# Patient Record
Sex: Female | Born: 1970 | Race: White | Hispanic: No | Marital: Single | Smoking: Never smoker
Health system: Southern US, Community
[De-identification: ages and names within clinical notes are randomized; demographics above are authoritative.]

## PROBLEM LIST (undated history)

## (undated) DIAGNOSIS — Z9889 Other specified postprocedural states: Secondary | ICD-10-CM

## (undated) DIAGNOSIS — R002 Palpitations: Secondary | ICD-10-CM

## (undated) DIAGNOSIS — I471 Supraventricular tachycardia, unspecified: Secondary | ICD-10-CM

## (undated) HISTORY — DX: Supraventricular tachycardia: I47.1

## (undated) HISTORY — PX: NO PAST SURGERIES: SHX2092

## (undated) HISTORY — DX: Palpitations: R00.2

## (undated) HISTORY — DX: Supraventricular tachycardia, unspecified: I47.10

---

## 2003-11-25 ENCOUNTER — Emergency Department (HOSPITAL_COMMUNITY): Admission: EM | Admit: 2003-11-25 | Discharge: 2003-11-25 | Payer: Self-pay | Admitting: Emergency Medicine

## 2011-12-19 ENCOUNTER — Telehealth: Payer: Self-pay | Admitting: Family Medicine

## 2011-12-19 ENCOUNTER — Encounter: Payer: Self-pay | Admitting: Family Medicine

## 2011-12-19 DIAGNOSIS — B351 Tinea unguium: Secondary | ICD-10-CM

## 2011-12-19 NOTE — Telephone Encounter (Signed)
Message copied by Pearline Cables on Tue Dec 19, 2011  4:27 PM ------      Message from: Abbe Amsterdam C      Created: Fri Dec 15, 2011 10:00 AM       Pt wonders about her fingernails- question refill of terbinafine.  I will pull chart and see

## 2011-12-19 NOTE — Telephone Encounter (Signed)
Anne Casey was in clinic with her mother a few days ago and asked about refilling her terbinafine to treat nail fungus.  Actually looking back in her chart I noted that we had already tried treating her twice, in April and in November of 2012.  I think we should refer her to dermatology instead of doing another round of lamisil.  Called home and work numbers but was unable to reach her.  Will go ahead and do a derm referral and send letter to her.

## 2012-01-26 ENCOUNTER — Other Ambulatory Visit: Payer: Self-pay | Admitting: Family Medicine

## 2012-01-26 MED ORDER — TERBINAFINE HCL 250 MG PO TABS: 250.0000 mg | ORAL_TABLET | Freq: Every day | ORAL | Status: DC

## 2012-01-26 NOTE — Progress Notes (Signed)
Spoke with Anne Casey on the phone this evening.  She is still very frustrated by fungal fingernails- see letter sent to her last month.  However, she has no insurance and really does not want to see a dermatologist.  I agreed to try lamisil one more time but one more time only.  Will rx to her pharmacy.

## 2012-07-04 LAB — CBC AND DIFFERENTIAL
HCT: 40 % (ref 36–46)
Hemoglobin: 12.8 g/dL (ref 12.0–16.0)
WBC: 8.5 10^3/mL

## 2012-08-09 ENCOUNTER — Ambulatory Visit: Payer: Self-pay | Admitting: Family Medicine

## 2012-08-09 VITALS — BP 132/88 | HR 72 | Temp 98.7°F | Resp 18 | Ht 67.0 in | Wt 243.0 lb

## 2012-08-09 DIAGNOSIS — B351 Tinea unguium: Secondary | ICD-10-CM

## 2012-08-09 DIAGNOSIS — I471 Supraventricular tachycardia: Secondary | ICD-10-CM

## 2012-08-09 DIAGNOSIS — I498 Other specified cardiac arrhythmias: Secondary | ICD-10-CM

## 2012-08-09 MED ORDER — METOPROLOL TARTRATE 50 MG PO TABS: 50.0000 mg | ORAL_TABLET | Freq: Every day | ORAL | Status: DC

## 2012-08-09 NOTE — Progress Notes (Signed)
[facility] [address], [place] [address] [phone]  Date:  08/09/2012   Name:  Anne Casey   DOB:  Jan 30, 1971   MRN:  [id number]  PCP:  Abbe Amsterdam, MD    Chief Complaint: follow up from hosptial visit and Medication Refill   History of Present Illness:  Anne Casey is a 41 y.o. very pleasant female patient who presents with the following:  She is here today for hospital follow-up. She was in [place] and noted the onset of heart palpations while in a store.  She has noted this in the past and used valarian root. However, this time her remedy did not work. She had a lot of pain and felt very bad- EMS came to get her and took her to [facility] center in [place], [place]. This occurred last month on 07/04/12.   While in the ED she had an evaluation including D dimer, TSH, cardiac enzymes which were ok.  She was released with Toprol XL 50 mg and a diagnosis of PSVT. She has been taking her metoprolol since. The tachycardia/ palpitations have not come back.  She sometimes feels "weak" around her heart- like the area is a little bit sore.  Taking her metoprolol resolves this problem- she takes it in the evening.  She usually feels like she is "ready to have her medication" by the time her next dose is due.    Per her report EMS had to shock her in the ambulance to regain BP.  I do not have any records that mention this, but from her report it sounds like her rate was so fast that she was losing BP.  She was conscious when she was shocked.   She has been avoiding caffeine, but she never did drink much caffeine.  She does not do drugs/ cocaine.  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  She works 1st, 2nd and 3rd shifts and her hours are constantly changing.  She sometimes does not get enough sleep and has a very hard time sleeping during the day when she works third shift.   She has noted a chest pressure with  working hard at [employer].  This has not changed and has been going on for some years.   She will be eligible for insurance through her job by 11/101/3 approx.   She would also like to further evaluate chronic nail changes on the ring finger of her left hand.  She has used lamisil but the apparent fungal infection never cleared.    There is no problem list on file for this patient.   History reviewed. No pertinent past medical history.  History reviewed. No pertinent past surgical history.  History  Substance Use Topics  . Smoking status: Never Smoker   . Smokeless tobacco: Not on file  . Alcohol Use: No    History reviewed. No pertinent family history.  No Known Allergies  Medication list has been reviewed and updated.  Current Outpatient Prescriptions on File Prior to Visit  Medication Sig Dispense Refill  . metoprolol (LOPRESSOR) 50 MG tablet Take 50 mg by mouth 2 (two) times daily.        Review of Systems:  As per HPI- otherwise negative.   Physical Examination: Filed Vitals:   08/09/12 0920  BP: 132/88  Pulse: 72  Temp: 98.7 F (37.1 C)  Resp: 18   Filed Vitals:  08/09/12 0920  Height: 5\' 7"  (1.702 m)  Weight: 243 lb (110.224 kg)   Body mass index is 38.06 kg/(m^2). Ideal Body Weight: Weight in (lb) to have BMI = 25: 159.3   GEN: WDWN, NAD, Non-toxic, A & O x 3, overweight HEENT: Atraumatic, Normocephalic. Neck supple. No masses, No LAD. Ears and Nose: No external deformity. CV: RRR, No M/G/R. No JVD. No thrill. No extra heart sounds. PULM: CTA B, no wheezes, crackles, rhonchi. No retractions. No resp. distress. No accessory muscle use. ABD: S, NT, ND EXTR: No c/c/e. Her left ring finger nail shows crumbling and thickness consistent with a fungal infection NEURO Normal gait.  PSYCH: Normally interactive. Conversant. Not depressed or anxious appearing.  Calm demeanor.   EKG: NSR, no tachycardia.  Read as normal by computer.  Some early repol  changes in V3 and V4 which appear identical to her EKG obtained from her ED visit   Assessment and Plan:  1. SVT (supraventricular tachycardia)  EKG 12-Lead, metoprolol (LOPRESSOR) 50 MG tablet, Ambulatory referral to Cardiology  2. Fungal nail infection  Ambulatory referral to Dermatology   Anne Casey was diagnosed with SVT a month ago, and is doing well with metoprolol.  However, the fact that she needed cardioversion is concerning.  I will refer her to cardiology for an evaluation.  She would like to wait until her insurance kicks in- this will be in about 2 weeks.  As she has been stable for a month since her ED visit this is reasonable.  She will let me know if she has any problems in the meantime.  She has no acute CP but may need an ETT for her stable chest discomfort (present for years).  Will have her start asa 81 mg  Also refer to derm for her nail problem COPLAND,JESSICA, MD

## 2012-08-09 NOTE — Patient Instructions (Addendum)
I will schedule you to see a cardiologist. If your symptoms come back or change in the meantime get help right away!  Start a baby aspirin (81mg ) in the meantime.    I will also arrange for you to see a dermatologist.

## 2012-08-14 ENCOUNTER — Encounter: Payer: Self-pay | Admitting: *Deleted

## 2012-08-23 ENCOUNTER — Encounter: Payer: Self-pay | Admitting: *Deleted

## 2012-08-23 DIAGNOSIS — R002 Palpitations: Secondary | ICD-10-CM | POA: Insufficient documentation

## 2012-09-02 ENCOUNTER — Ambulatory Visit: Payer: Self-pay | Admitting: Cardiovascular Disease

## 2013-02-12 ENCOUNTER — Other Ambulatory Visit (INDEPENDENT_AMBULATORY_CARE_PROVIDER_SITE_OTHER): Payer: BC Managed Care – PPO | Admitting: Family Medicine

## 2013-02-12 ENCOUNTER — Other Ambulatory Visit: Payer: Self-pay | Admitting: Family Medicine

## 2013-02-12 DIAGNOSIS — R002 Palpitations: Secondary | ICD-10-CM

## 2013-02-12 DIAGNOSIS — B351 Tinea unguium: Secondary | ICD-10-CM

## 2013-02-12 LAB — POCT CBC
Granulocyte percent: 65 %G (ref 37–80)
HCT, POC: 41.8 % (ref 37.7–47.9)
MID (cbc): 0.4 (ref 0–0.9)
POC Granulocyte: 4 (ref 2–6.9)
POC LYMPH PERCENT: 27.8 %L (ref 10–50)
Platelet Count, POC: 252 10*3/uL (ref 142–424)
RBC: 4.7 M/uL (ref 4.04–5.48)
RDW, POC: 14.4 %

## 2013-02-12 LAB — COMPREHENSIVE METABOLIC PANEL
AST: 12 U/L (ref 0–37)
Albumin: 4.4 g/dL (ref 3.5–5.2)
Alkaline Phosphatase: 80 U/L (ref 39–117)
Chloride: 105 mEq/L (ref 96–112)
Potassium: 4.3 mEq/L (ref 3.5–5.3)
Sodium: 138 mEq/L (ref 135–145)
Total Protein: 7.4 g/dL (ref 6.0–8.3)

## 2013-02-12 NOTE — Progress Notes (Addendum)
Patient here because her mother is being seen and she would like me to take care of her nail fungus as well.  She would like to try treating her fungal fingernail again.  We have treated her with lamisil in the past and her nail cleared, but then the thickening came back.  I had referred her to dermatology but she did not go due to financial concerns.   We have not done labs in some time so will check labs prior to starting Lamisil again.  Also took culture of her nail today.  Will contact her when I get her labs back and assuming ok we can start lamisil.    She states there is no chance of pregnancy   02/14/13- received the rest of he labs:  Results for orders placed in visit on 02/12/13  CULTURE, FUNGUS WITHOUT SMEAR      Result Value Range   Preliminary Report Culture in Progress for 4 Weeks    COMPREHENSIVE METABOLIC PANEL      Result Value Range   Sodium 138  135 - 145 mEq/L   Potassium 4.3  3.5 - 5.3 mEq/L   Chloride 105  96 - 112 mEq/L   CO2 25  19 - 32 mEq/L   Glucose, Bld 107 (*) 70 - 99 mg/dL   BUN 8  6 - 23 mg/dL   Creat 4.09  8.11 - 9.14 mg/dL   Total Bilirubin 0.4  0.3 - 1.2 mg/dL   Alkaline Phosphatase 80  39 - 117 U/L   AST 12  0 - 37 U/L   ALT 16  0 - 35 U/L   Total Protein 7.4  6.0 - 8.3 g/dL   Albumin 4.4  3.5 - 5.2 g/dL   Calcium 9.3  8.4 - 78.2 mg/dL  POCT CBC      Result Value Range   WBC 6.2  4.6 - 10.2 K/uL   Lymph, poc 1.7  0.6 - 3.4   POC LYMPH PERCENT 27.8  10 - 50 %L   MID (cbc) 0.4  0 - 0.9   POC MID % 7.2  0 - 12 %M   POC Granulocyte 4.0  2 - 6.9   Granulocyte percent 65.0  37 - 80 %G   RBC 4.70  4.04 - 5.48 M/uL   Hemoglobin 13.1  12.2 - 16.2 g/dL   HCT, POC 95.6  21.3 - 47.9 %   MCV 88.9  80 - 97 fL   MCH, POC 27.9  27 - 31.2 pg   MCHC 31.3 (*) 31.8 - 35.4 g/dL   RDW, POC 08.6     Platelet Count, POC 252  142 - 424 K/uL   MPV 10.0  0 - 99.8 fL   Called her and LMOM- her labs are ok, but we also need to get her BP and pulse as she is also on  a BB.  Will have her check her BP and pulse as well and write down.  Will CB later.  02/17/13- called and discussed with her.  She will check her BP and pulse- as long as her pulse is at least 60- 70 BPM she can use the lamisil. Will plan to follow- up with the results of her nail culture and to recheck her LFTs in 4- 6 weeks.

## 2013-02-17 ENCOUNTER — Other Ambulatory Visit: Payer: Self-pay | Admitting: Family Medicine

## 2013-02-17 DIAGNOSIS — B351 Tinea unguium: Secondary | ICD-10-CM

## 2013-02-17 DIAGNOSIS — Z5181 Encounter for therapeutic drug level monitoring: Secondary | ICD-10-CM

## 2013-02-17 MED ORDER — TERBINAFINE HCL 250 MG PO TABS: 250.0000 mg | ORAL_TABLET | Freq: Every day | ORAL | Status: DC

## 2013-03-20 ENCOUNTER — Telehealth: Payer: Self-pay | Admitting: Family Medicine

## 2013-03-20 NOTE — Telephone Encounter (Signed)
Called her to discuss her nail culture- negative for fungus.  She does think that her proximal nail plate is clearing.  However, explained to her that she may have another type of nail problem.  Encouraged a dermatology appt.  However, she just quit her job and does not have insurance at this time.  She plans to get a new job soon.  Asked her to call me as soon as she has health insurance again, and I will refer her to derm then.

## 2013-09-04 ENCOUNTER — Other Ambulatory Visit: Payer: Self-pay | Admitting: Family Medicine

## 2013-09-05 ENCOUNTER — Other Ambulatory Visit: Payer: Self-pay | Admitting: Physician Assistant

## 2013-10-02 ENCOUNTER — Other Ambulatory Visit: Payer: Self-pay | Admitting: Physician Assistant

## 2015-08-16 ENCOUNTER — Ambulatory Visit (INDEPENDENT_AMBULATORY_CARE_PROVIDER_SITE_OTHER): Payer: Self-pay | Admitting: Family Medicine

## 2015-08-16 VITALS — BP 122/85 | HR 85 | Temp 97.9°F | Resp 18 | Ht 67.0 in | Wt 254.4 lb

## 2015-08-16 DIAGNOSIS — H60393 Other infective otitis externa, bilateral: Secondary | ICD-10-CM

## 2015-08-16 DIAGNOSIS — Z131 Encounter for screening for diabetes mellitus: Secondary | ICD-10-CM

## 2015-08-16 DIAGNOSIS — E785 Hyperlipidemia, unspecified: Secondary | ICD-10-CM

## 2015-08-16 DIAGNOSIS — Z13220 Encounter for screening for lipoid disorders: Secondary | ICD-10-CM

## 2015-08-16 DIAGNOSIS — R7309 Other abnormal glucose: Secondary | ICD-10-CM

## 2015-08-16 DIAGNOSIS — Z130 Encounter for screening for diseases of the blood and blood-forming organs and certain disorders involving the immune mechanism: Secondary | ICD-10-CM

## 2015-08-16 LAB — COMPREHENSIVE METABOLIC PANEL
ALBUMIN: 4.5 g/dL (ref 3.6–5.1)
ALT: 22 U/L (ref 6–29)
AST: 17 U/L (ref 10–35)
Alkaline Phosphatase: 90 U/L (ref 33–115)
BILIRUBIN TOTAL: 0.5 mg/dL (ref 0.2–1.2)
BUN: 9 mg/dL (ref 7–25)
CHLORIDE: 103 mmol/L (ref 98–110)
CO2: 25 mmol/L (ref 20–31)
CREATININE: 0.62 mg/dL (ref 0.50–1.10)
Calcium: 9.5 mg/dL (ref 8.6–10.2)
Glucose, Bld: 114 mg/dL — ABNORMAL HIGH (ref 65–99)
Potassium: 4 mmol/L (ref 3.5–5.3)
SODIUM: 139 mmol/L (ref 135–146)
TOTAL PROTEIN: 7.7 g/dL (ref 6.1–8.1)

## 2015-08-16 LAB — CBC
HCT: 40.3 % (ref 36.0–46.0)
HEMOGLOBIN: 13.6 g/dL (ref 12.0–15.0)
MCH: 28.2 pg (ref 26.0–34.0)
MCHC: 33.7 g/dL (ref 30.0–36.0)
MCV: 83.4 fL (ref 78.0–100.0)
MPV: 10.3 fL (ref 8.6–12.4)
PLATELETS: 267 10*3/uL (ref 150–400)
RBC: 4.83 MIL/uL (ref 3.87–5.11)
RDW: 13.6 % (ref 11.5–15.5)
WBC: 8.3 10*3/uL (ref 4.0–10.5)

## 2015-08-16 LAB — LIPID PANEL
Cholesterol: 197 mg/dL (ref 125–200)
HDL: 43 mg/dL — ABNORMAL LOW (ref >=46)
LDL CALC: 123 mg/dL (ref <130)
TRIGLYCERIDES: 153 mg/dL — AB (ref <150)
Total CHOL/HDL Ratio: 4.6 Ratio (ref <=5.0)
VLDL: 31 mg/dL — AB (ref <30)

## 2015-08-16 MED ORDER — NEOMYCIN-POLYMYXIN-HC 3.5-10000-1 OT SOLN: 4.0000 [drp] | Freq: Three times a day (TID) | OTIC | Status: DC

## 2015-08-16 MED ORDER — AMOXICILLIN 500 MG PO CAPS: 1000.0000 mg | ORAL_CAPSULE | Freq: Two times a day (BID) | ORAL | Status: DC

## 2015-08-16 NOTE — Progress Notes (Signed)
[facility] [address], [place] [id number] [phone]  Date:  08/16/2015   Name:  Anne Casey   DOB:  1971/03/01   MRN:  [id number]  PCP:  Abbe AmsterdamOPLAND,Nikitta Sobiech, MD    Chief Complaint: Otalgia and Fasting lab   History of Present Illness:  Anne Casey is a 44 y.o. very pleasant female patient who presents with the following:  She has noted pain in both of her ears for a couple of weeks.  She feels like she cannot hear as well from the left ear. She has noted a little drainage- she tried peroxide.  No blood from her ears.  She has not been swimming but has gotten some water in her ears from the shower.  No fever, ST, or cough.  She OW feels ok  She is fasting today for labs-declines a flu shot today  Patient Active Problem List   Diagnosis Date Noted  . Palpitation   . SVT (supraventricular tachycardia) (HCC) 08/09/2012    Past Medical History  Diagnosis Date  . SVT (supraventricular tachycardia) (HCC)   . Palpitation     No past surgical history on file.  Social History  Substance Use Topics  . Smoking status: Never Smoker   . Smokeless tobacco: None  . Alcohol Use: No    No family history on file.  No Known Allergies  Medication list has been reviewed and updated.  Current Outpatient Prescriptions on File Prior to Visit  Medication Sig Dispense Refill  . metoprolol (LOPRESSOR) 50 MG tablet Take 1 tablet (50 mg total) by mouth daily. PATIENT NEEDS OFFICE VISIT FOR ADDITIONAL REFILLS (Patient not taking: Reported on 08/16/2015) 30 tablet 0   No current facility-administered medications on file prior to visit.    Review of Systems:  As per HPI- otherwise negative.   Physical Examination: Filed Vitals:   08/16/15 0848  BP: 122/85  Pulse: 85  Temp: 97.9 F (36.6 C)  Resp: 18   Filed Vitals:   08/16/15 0848  Height: 5\' 7"  (1.702 m)  Weight: 254 lb 6.4 oz (115.395 kg)   Body mass index is 39.84 kg/(m^2). Ideal Body  Weight: Weight in (lb) to have BMI = 25: 159.3  GEN: WDWN, NAD, Non-toxic, A & O x 3, obese, looks well HEENT: Atraumatic, Normocephalic. Neck supple. No masses, No LAD.  PEERL, EOMI.  Oropharynx wn Ears and Nose: No external deformity. CV: RRR, No M/G/R. No JVD. No thrill. No extra heart sounds. PULM: CTA B, no wheezes, crackles, rhonchi. No retractions. No resp. distress. No accessory muscle use. EXTR: No c/c/e NEURO Normal gait.  PSYCH: Normally interactive. Conversant. Not depressed or anxious appearing.  Calm demeanor.  Both ear canals show debris, redness and mild swelling.  TM are intact  Assessment and Plan: Otitis, externa, infective, bilateral - Plan: amoxicillin (AMOXIL) 500 MG capsule, neomycin-polymyxin-hydrocortisone (CORTISPORIN) otic solution  Screening for hyperlipidemia - Plan: Lipid panel  Screening for deficiency anemia - Plan: CBC  Screening for diabetes mellitus - Plan: Comprehensive metabolic panel  Treat for OE with amoxicillin and cortisporin drops- follow-up if not better in a few days, Sooner if worse.    Labs as above   Signed Abbe AmsterdamJessica Rad Gramling, MD

## 2015-08-16 NOTE — Patient Instructions (Signed)
Use the ear drop and amoxcillin pill for your ear infection Let me know if your ears do not feel better in the next 2 days or so I will be in touch with your labs I would encourage you to have a full physical, pap and mammogram soon!

## 2015-08-18 ENCOUNTER — Encounter: Payer: Self-pay | Admitting: Family Medicine

## 2015-08-18 NOTE — Addendum Note (Signed)
Addended by: Abbe AmsterdamOPLAND, Nickolette Espinola C on: 08/18/2015 12:40 PM   Modules accepted: Orders

## 2015-11-05 ENCOUNTER — Encounter: Payer: Self-pay | Admitting: Family Medicine

## 2015-11-10 ENCOUNTER — Encounter: Payer: Self-pay | Admitting: Family Medicine

## 2016-05-20 ENCOUNTER — Emergency Department (HOSPITAL_COMMUNITY)
Admission: EM | Admit: 2016-05-20 | Discharge: 2016-05-20 | Disposition: A | Discharge status: Home / Self Care | Payer: Self-pay | Attending: Emergency Medicine | Admitting: Emergency Medicine

## 2016-05-20 ENCOUNTER — Emergency Department (HOSPITAL_COMMUNITY): Payer: Self-pay

## 2016-05-20 ENCOUNTER — Encounter (HOSPITAL_COMMUNITY): Payer: Self-pay

## 2016-05-20 DIAGNOSIS — I471 Supraventricular tachycardia: Secondary | ICD-10-CM | POA: Insufficient documentation

## 2016-05-20 DIAGNOSIS — R002 Palpitations: Secondary | ICD-10-CM

## 2016-05-20 LAB — CBC
HEMATOCRIT: 40 % (ref 36.0–46.0)
Hemoglobin: 12.8 g/dL (ref 12.0–15.0)
MCH: 28.2 pg (ref 26.0–34.0)
MCHC: 32 g/dL (ref 30.0–36.0)
MCV: 88.1 fL (ref 78.0–100.0)
Platelets: 233 10*3/uL (ref 150–400)
RBC: 4.54 MIL/uL (ref 3.87–5.11)
RDW: 13.5 % (ref 11.5–15.5)
WBC: 10 10*3/uL (ref 4.0–10.5)

## 2016-05-20 LAB — BASIC METABOLIC PANEL
Anion gap: 7 (ref 5–15)
BUN: 11 mg/dL (ref 6–20)
CHLORIDE: 108 mmol/L (ref 101–111)
CO2: 24 mmol/L (ref 22–32)
Calcium: 9.1 mg/dL (ref 8.9–10.3)
Creatinine, Ser: 0.74 mg/dL (ref 0.44–1.00)
GFR calc non Af Amer: >60 mL/min (ref >60)
Glucose, Bld: 135 mg/dL — ABNORMAL HIGH (ref 65–99)
POTASSIUM: 4 mmol/L (ref 3.5–5.1)
SODIUM: 139 mmol/L (ref 135–145)

## 2016-05-20 LAB — I-STAT TROPONIN, ED: Troponin i, poc: 0.05 ng/mL (ref 0.00–0.08)

## 2016-05-20 MED ORDER — METOPROLOL TARTRATE 50 MG PO TABS: 50.0000 mg | ORAL_TABLET | Freq: Every day | ORAL | 2 refills | Status: DC

## 2016-05-20 NOTE — ED Provider Notes (Signed)
[facility]-EMERGENCY DEPT Provider Note   CSN: [id number] Arrival date & time: 05/20/16  0009  First Provider Contact:  First MD Initiated Contact with Patient 05/20/16 0034     By signing my name below, I, Linna Darner, attest that this documentation has been prepared under the direction and in the presence of physician practitioner, Derwood Kaplan, MD. Electronically Signed: Linna Darner, Scribe. 05/20/2016. 12:34 AM.  History   Chief Complaint Chief Complaint  Patient presents with  . Chest Pain  . Palpitations    The history is provided by the patient. No language interpreter was used.     HPI Comments: Anne Casey is a 45 y.o. female brought in by EMS, with PMHx of SVT and palpitations, who presents to the Emergency Department complaining of sudden onset, constant, improving, dull, chest pain beginning around 3 hours ago. Pt notes associated palpitations, chest tightness, nausea, diaphoresis, and generalized weakness. She notes she was washing dishes when her symptoms presented. She states her chest pain radiated down her left arm and notes she experienced left hand numbness initially, but these symptoms have resolved. Pt states her CP was 10/10 at worst and is 1/10 currently. She reports she was given Adenison 6 mg in the EMS van with some relief of her palpitations and chest pain. She notes a h/o palpitations and chest discomfort but has not had an episode in 3 years since discontinuing her metoprolol prescription (due to cost). She has not had a stress test taken in the past nor has she seen a cardiologist for her symptoms. She denies h/o drug use, substance abuse, or smoking. She denies FHx of heart attack. Pt further denies SOB, fever, cough, or any other associated symptoms.  Past Medical History:  Diagnosis Date  . Palpitation   . SVT (supraventricular tachycardia) [facility])     Patient Active Problem List   Diagnosis Date Noted  . Palpitation   . SVT (supraventricular  tachycardia) (HCC) 08/09/2012    History reviewed. No pertinent surgical history.  OB History    No data available       Home Medications    Prior to Admission medications   Medication Sig Start Date End Date Taking? Authorizing Provider  metoprolol (LOPRESSOR) 50 MG tablet Take 1 tablet (50 mg total) by mouth daily. 05/20/16   Derwood Kaplan, MD    Family History No family history on file.  Social History Social History  Substance Use Topics  . Smoking status: Never Smoker  . Smokeless tobacco: Not on file  . Alcohol use No     Allergies   Review of patient's allergies indicates no known allergies.   Review of Systems Review of Systems  A complete 10 system review of systems was obtained and all systems are negative except as noted in the HPI and PMH.   Physical Exam Updated Vital Signs BP 125/78   Pulse 84   Resp 16   LMP 05/09/2016 (Approximate)   SpO2 97%   Physical Exam  Constitutional: She is oriented to person, place, and time. She appears well-developed and well-nourished. No distress.  HENT:  Head: Normocephalic and atraumatic.  Eyes: Conjunctivae and EOM are normal.  Neck: Neck supple. No tracheal deviation present.  Cardiovascular: Tachycardia present.   Heart rate 105. No JVD.  Pulmonary/Chest: Effort normal. No respiratory distress.  Anterior lungs are clear.  Musculoskeletal: Normal range of motion.  2+ and equal radial pulse bilaterally.  Neurological: She is alert and oriented to person, place, and  time.  Skin: Skin is warm and dry.  Psychiatric: She has a normal mood and affect. Her behavior is normal.  Nursing note and vitals reviewed.   ED Treatments / Results  Labs (all labs ordered are listed, but only abnormal results are displayed) Labs Reviewed  BASIC METABOLIC PANEL - Abnormal; Notable for the following:       Result Value   Glucose, Bld 135 (*)    All other components within normal limits  CBC  I-STAT TROPOININ, ED     EKG  EKG Interpretation  Date/Time:  Saturday May 20 2016 00:14:34 EDT Ventricular Rate:  108 PR Interval:    QRS Duration: 83 QT Interval:  330 QTC Calculation: 443 R Axis:   75 Text Interpretation:  Sinus tachycardia Right atrial enlargement Abnormal R-wave progression, early transition No acute changes No old tracing to compare Nonspecific T wave abnormality in lead III Confirmed by Rhunette Croft, MD, Janey Genta [phone]) on 05/20/2016 12:27:16 AM       Radiology Dg Chest 2 View  Result Date: 05/20/2016 CLINICAL DATA:  45 year old female with chest pain EXAM: CHEST  2 VIEW COMPARISON:  Thoracic spine radiograph dated 11/25/2003 FINDINGS: The heart size and mediastinal contours are within normal limits. Both lungs are clear. The visualized skeletal structures are unremarkable. IMPRESSION: No active cardiopulmonary disease. Electronically Signed   By: Elgie Collard M.D.   On: 05/20/2016 01:17    Procedures Procedures (including critical care time)  DIAGNOSTIC STUDIES: Oxygen Saturation is 96% on RA, adequate by my interpretation.    COORDINATION OF CARE: 12:34 AM Discussed treatment plan with pt at bedside and pt agreed to plan.   Medications Ordered in ED Medications - No data to display   Initial Impression / Assessment and Plan / ED Course  I have reviewed the triage vital signs and the nursing notes.  Pertinent labs & imaging results that were available during my care of the patient were reviewed by me and considered in my medical decision making (see chart for details).  Clinical Course  Comment By Time  No PSVT anymore. Will d/c w/ $4 metoprolol 50 mg with refills and [facility] # provided.  Derwood Kaplan, MD 08/05 [phone]   I personally performed the services described in this documentation, which was scribed in my presence. The recorded information has been reviewed and is accurate.   Pt comes in with cc of chest pain palpitations. She has hx of PSVT and per  EMS reports, she was in a PSVT which was broken with Adenosine 6 mg. Pt has minimal chest discomfort now. We will monitor her in the ER for a short while. Labs ordered. Anticipate d/c. Pt lost to f/u as she doesn't have insurance, used to be on metoprolol.  Final Clinical Impressions(s) / ED Diagnoses   Final diagnoses:  PSVT (paroxysmal supraventricular tachycardia) (HCC)  Heart palpitations    New Prescriptions New Prescriptions   METOPROLOL (LOPRESSOR) 50 MG TABLET    Take 1 tablet (50 mg total) by mouth daily.     Derwood Kaplan, MD 05/20/16 [phone]

## 2016-05-20 NOTE — ED Triage Notes (Signed)
Pt states he was washing dishes and had sudden onset of chest pain and palpitations; Pt has hx of palpitations with RX for carvedilol but does not take it due to cost; Pt c/o of chest pain 10/10 en route; pt c/o of slight pain at 1/10 on arrival; Pt was initially SVT at 225 on EMS arrival; pt given 6 mg Adenison and converted to HR of 108; pt HR 107 on arrival.

## 2016-05-20 NOTE — ED Notes (Signed)
Patient transported to X-ray 

## 2016-05-20 NOTE — ED Notes (Signed)
Pt departed in NAD.  

## 2016-12-06 ENCOUNTER — Ambulatory Visit (INDEPENDENT_AMBULATORY_CARE_PROVIDER_SITE_OTHER): Payer: Self-pay | Admitting: Physician Assistant

## 2016-12-06 VITALS — BP 140/84 | HR 75 | Temp 98.2°F | Resp 16 | Ht 67.0 in | Wt 259.2 lb

## 2016-12-06 DIAGNOSIS — R51 Headache: Secondary | ICD-10-CM

## 2016-12-06 DIAGNOSIS — R519 Headache, unspecified: Secondary | ICD-10-CM

## 2016-12-06 DIAGNOSIS — I10 Essential (primary) hypertension: Secondary | ICD-10-CM

## 2016-12-06 DIAGNOSIS — R079 Chest pain, unspecified: Secondary | ICD-10-CM

## 2016-12-06 MED ORDER — METOPROLOL TARTRATE 50 MG PO TABS: 50.0000 mg | ORAL_TABLET | Freq: Every day | ORAL | 1 refills | Status: DC

## 2016-12-06 NOTE — Patient Instructions (Addendum)
Cardiology will call you to schedule an appointment.  Follow-up with me after you are seen by cardiology.    Thank you for coming in today. I hope you feel we met your needs.  Feel free to call [facility] if you have any questions or further requests.  Please consider signing up for MyChart if you do not already have it, as this is a great way to communicate with me.  Best,  Whitney McVey, PA-C   IF you received an x-ray today, you will receive an invoice from [facility] Radiology. Please contact [facility] at [phone] with questions or concerns regarding your invoice.   IF you received labwork today, you will receive an invoice from [facility]. Please contact [facility] at [phone] with questions or concerns regarding your invoice.   Our billing staff will not be able to assist you with questions regarding bills from these companies.  You will be contacted with the lab results as soon as they are available. The fastest way to get your results is to activate your My Chart account. Instructions are located on the last page of this paperwork. If you have not heard from [place] regarding the results in 2 weeks, please contact this office.

## 2016-12-06 NOTE — Progress Notes (Signed)
   Anne Casey  MRN: [id number] DOB: 1970-11-24  PCP: Abbe AmsterdamOPLAND,JESSICA, MD  Subjective:  Pt is a pleasant 46 yo female PMH PSVT who presents to clinic for headaches and chest pain with exertion.    Chest pain is described as "like something bubbling around my heart". Happens occasionally during the day. Worse with walking, pain radiates up left side of neck. Endorses "racing heart" at rest occasionally - about 2-3 times a week.  She does not exercise because of these symptoms.  No symptoms presently, feeling well.  She feels "it's about time I get checked out". She does not take home blood pressure readings.  She was seen in the emergency department six months ago for chest pain. Dx with PSVT and broken with Adenosine 6mg . She was d/c'd and started on metoprolol 50mg . She needs refill of this. She has been on Metoprolol in the past, has not taken this for four years.  Denies SOB, chest tightness, swelling of feet, chest pain at rest, syncope, cough.   She is a Building services engineerhotel general manager. Notes symptoms are worse with increased stress at work.  She was referred to cardiology in the past, however has never been due to no insurance.   Pt does not smoke. No h/o diabetes.  Fhx: Mother heart diease. Sister HTN, possible arrhyhtmia.   Review of Systems  Constitutional: Negative for chills, diaphoresis, fatigue and fever.  Respiratory: Negative for cough, chest tightness, shortness of breath and wheezing.   Cardiovascular: Positive for chest pain and palpitations. Negative for leg swelling.  Gastrointestinal: Negative for abdominal pain, diarrhea, nausea and vomiting.  Neurological: Positive for headaches. Negative for dizziness, syncope, weakness and light-headedness.    Patient Active Problem List   Diagnosis Date Noted  . Palpitation   . SVT (supraventricular tachycardia) (HCC) 08/09/2012    Current Outpatient Prescriptions on File Prior to Visit  Medication Sig Dispense Refill  .  metoprolol (LOPRESSOR) 50 MG tablet Take 1 tablet (50 mg total) by mouth daily. 60 tablet 2   No current facility-administered medications on file prior to visit.     No Known Allergies   Objective:  BP 140/84   Pulse 75   Temp 98.2 F (36.8 C) (Oral)   Resp 16   Ht 5\' 7"  (1.702 m)   Wt 259 lb 3.2 oz (117.6 kg)   SpO2 99%   BMI 40.60 kg/m   Physical Exam  Constitutional: She is oriented to person, place, and time and well-developed, well-nourished, and in no distress. No distress.  Cardiovascular: Normal rate, regular rhythm, normal heart sounds and intact distal pulses.  Exam reveals no decreased pulses.   Pulmonary/Chest: Effort normal. No respiratory distress.  Neurological: She is alert and oriented to person, place, and time. GCS score is 15.  Skin: Skin is warm and dry.  Psychiatric: Mood, memory, affect and judgment normal.  Vitals reviewed.   Assessment and Plan :  1. Essential hypertension 2. Chest pain on exertion 3. Intractable episodic headache, unspecified headache type - metoprolol (LOPRESSOR) 50 MG tablet; Take 1 tablet (50 mg total) by mouth daily.  Dispense: 60 tablet; Refill: 1 - Ambulatory referral to Cardiology - Lipid panel - She is not having any symptoms at this time. Blood pressure Ok.  Will refill medication. Lab pending.  Will refer to cardiology for possible echo and/or Holter monitor.   Marco CollieWhitney Tisheena Maguire, PA-C  [facility] at [facility] 12/06/2016 11:25 AM

## 2016-12-07 LAB — LIPID PANEL
Chol/HDL Ratio: 4.6 — ABNORMAL HIGH (ref 0.0–4.4)
Cholesterol, Total: 188 mg/dL (ref 100–199)
HDL: 41 mg/dL (ref >39)
LDL Calculated: 108 — ABNORMAL HIGH (ref 0–99)
Triglycerides: 194 mg/dL — ABNORMAL HIGH (ref 0–149)
VLDL Cholesterol Cal: 39 (ref 5–40)

## 2016-12-08 ENCOUNTER — Encounter: Payer: Self-pay | Admitting: Physician Assistant

## 2016-12-18 ENCOUNTER — Encounter: Payer: Self-pay | Admitting: Physician Assistant

## 2017-04-11 ENCOUNTER — Other Ambulatory Visit: Payer: Self-pay | Admitting: Physician Assistant

## 2017-04-11 DIAGNOSIS — I10 Essential (primary) hypertension: Secondary | ICD-10-CM

## 2017-09-15 DIAGNOSIS — Z9889 Other specified postprocedural states: Secondary | ICD-10-CM

## 2017-09-15 HISTORY — DX: Other specified postprocedural states: Z98.890

## 2017-09-18 NOTE — Progress Notes (Addendum)
[facility] [address], [address] [place], [place] [id number] [phone] [phone]  Date:  09/20/2017   Name:  Anne FishLesia I Meddings   DOB:  1971-07-01   MRN:  [id number]  PCP:  Pearline Cablesopland, Jessica C, MD    Chief Complaint: Follow-up (Pt here for med refil and lab work. )   History of Present Illness:  Anne Casey is a 46 y.o. very pleasant female patient who presents with the following:  Recheck visit today Last seen by myself for an office visit about 2 years ago, although I see her on a regular basis when she comes in with her mom. Her mom's colon cancer has entered terminal phase and she is expected to live not more than 6 months.  Virl AxeLesia states that she is sad, but is handling this ok  History of SVT, treated with metoprolol She ran out of her metoprolol - she has been out of this for about 2 months.  However she feels like she still had some palpitations even when she is taking the medication I have not seen her in some time so I do not have any recent data about her BP or pulse Pt feels that sometimes her BP must be very high- she describes an incident about 3 months ago when "my blood pressure was so high., I could not get out of bed, had a terrible HA and was vomiting." however she did not actually check her BP at that time and did not seek any medical attention  Pap: declines Mammo: declines Flu: declines Tetanus: declines Due for routine labs today- she would like to have blood drawn Discussed a holter monitor and she would like to have this done   Patient Active Problem List   Diagnosis Date Noted  . Palpitation   . SVT (supraventricular tachycardia) (HCC) 08/09/2012    Past Medical History:  Diagnosis Date  . Palpitation   . SVT (supraventricular tachycardia) (HCC)     History reviewed. No pertinent surgical history.  Social History   Tobacco Use  . Smoking status: Never Smoker  . Smokeless tobacco: Never Used   Substance Use Topics  . Alcohol use: No  . Drug use: No    History reviewed. No pertinent family history.  No Known Allergies  Medication list has been reviewed and updated.  No current outpatient medications on file prior to visit.   No current facility-administered medications on file prior to visit.     Review of Systems:  As per HPI- otherwise negative. No fever or chills No CP or SOB No rash, cough, ST   Physical Examination: Vitals:   09/20/17 0859  BP: 140/82  Pulse: 82  Temp: 97.7 F (36.5 C)  SpO2: 98%   Vitals:   09/20/17 0859  Weight: 260 lb (117.9 kg)  Height: 5\' 7"  (1.702 m)   Body mass index is 40.72 kg/m. Ideal Body Weight: Weight in (lb) to have BMI = 25: 159.3  GEN: WDWN, NAD, Non-toxic, A & O x 3, obese, otherwise looks well HEENT: Atraumatic, Normocephalic. Neck supple. No masses, No LAD. Bilateral TM wnl, oropharynx normal.  PEERL,EOMI.   Ears and Nose: No external deformity. CV: RRR, No M/G/R. No JVD. No thrill. No extra heart sounds. PULM: CTA B, no wheezes, crackles, rhonchi. No retractions. No resp. distress. No accessory muscle use. EXTR: No c/c/e NEURO Normal gait.  PSYCH: Normally interactive. Conversant. Not depressed or anxious appearing.  Calm demeanor.    Assessment and Plan: SVT (supraventricular tachycardia) (HCC) - Plan: metoprolol tartrate (LOPRESSOR) 50 MG tablet, TSH, Holter monitor - 24 hour  Essential hypertension - Plan: metoprolol tartrate (LOPRESSOR) 50 MG tablet, CBC  Screening for hyperlipidemia - Plan: Lipid panel  Screening for diabetes mellitus - Plan: Comprehensive metabolic panel, Hemoglobin A1c  Here today for a recheck visit Refilled her metoprolol- will try having her take 25 BID to see if this will better control her sx Will also set up a holter monitor for her Labs pending as above Encouraged her to please come in for a real physical, pap, mammo asap. Offered to do her pap today but she  declines Will plan further follow- up pending labs. Explained that high BP generally does not cause sx- ? She had a migraine headache. She will get a BP cuff so she can monitor at home   It was good to see you today!  I am sorry that your mom is so sick- let me know how I can help Please go back on your metoprolol- let's have you try a 1/2 tablet twice a day to better control your symptoms I am going to set you up for a heart monitor to see what your pulse is doing as you go about your day Please pick up a BP cuff so you can monitor your BP at home   I would like to do a complete physical exam for you- pap, breast exam, and you need a mammogram.  Please come and see me to take care of these things as soon as you can Signed Abbe AmsterdamJessica Copland, MD  Received her labs 12/9- letter to pt Results for orders placed or performed in visit on 09/20/17  CBC  Result Value Ref Range   WBC 6.9 4.0 - 10.5 K/uL   RBC 4.61 3.87 - 5.11 Mil/uL   Platelets 231.0 150.0 - 400.0 K/uL   Hemoglobin 13.3 12.0 - 15.0 g/dL   HCT 16.140.3 09.636.0 - 04.546.0 %   MCV 87.4 78.0 - 100.0 fl   MCHC 32.9 30.0 - 36.0 g/dL   RDW 40.913.5 81.111.5 - 91.415.5 %  Comprehensive metabolic panel  Result Value Ref Range   Sodium 140 135 - 145 mEq/L   Potassium 3.7 3.5 - 5.1 mEq/L   Chloride 106 96 - 112 mEq/L   CO2 25 19 - 32 mEq/L   Glucose, Bld 101 (H) 70 - 99 mg/dL   BUN 9 6 - 23 mg/dL   Creatinine, Ser 7.820.61 0.40 - 1.20 mg/dL   Total Bilirubin 0.4 0.2 - 1.2 mg/dL   Alkaline Phosphatase 78 39 - 117 U/L   AST 16 0 - 37 U/L   ALT 23 0 - 35 U/L   Total Protein 7.1 6.0 - 8.3 g/dL   Albumin 4.4 3.5 - 5.2 g/dL   Calcium 8.8 8.4 - 95.610.5 mg/dL   GFR 213.08111.48 >65.78>60.00 mL/min  Hemoglobin A1c  Result Value Ref Range   Hgb A1c MFr Bld 5.8 4.6 - 6.5 %  Lipid panel  Result Value Ref Range   Cholesterol 164 0 - 200 mg/dL   Triglycerides 469.6146.0 0.0 - 149.0 mg/dL   HDL 29.5240.20 >84.13>39.00 mg/dL   VLDL 24.429.2 0.0 - 01.040.0 mg/dL   LDL Cholesterol 95 0 - 99 mg/dL    Total CHOL/HDL Ratio 4    NonHDL 123.97   TSH  Result Value Ref Range   TSH 0.81 0.35 - 4.50 uIU/mL

## 2017-09-20 ENCOUNTER — Encounter: Payer: Self-pay | Admitting: Family Medicine

## 2017-09-20 ENCOUNTER — Ambulatory Visit (INDEPENDENT_AMBULATORY_CARE_PROVIDER_SITE_OTHER): Payer: Self-pay | Admitting: Family Medicine

## 2017-09-20 VITALS — BP 140/82 | HR 82 | Temp 97.7°F | Ht 67.0 in | Wt 260.0 lb

## 2017-09-20 DIAGNOSIS — I471 Supraventricular tachycardia: Secondary | ICD-10-CM

## 2017-09-20 DIAGNOSIS — Z131 Encounter for screening for diabetes mellitus: Secondary | ICD-10-CM

## 2017-09-20 DIAGNOSIS — I10 Essential (primary) hypertension: Secondary | ICD-10-CM

## 2017-09-20 DIAGNOSIS — Z13220 Encounter for screening for lipoid disorders: Secondary | ICD-10-CM

## 2017-09-20 LAB — CBC
HCT: 40.3 % (ref 36.0–46.0)
HEMOGLOBIN: 13.3 g/dL (ref 12.0–15.0)
MCHC: 32.9 g/dL (ref 30.0–36.0)
MCV: 87.4 fl (ref 78.0–100.0)
Platelets: 231 10*3/uL (ref 150.0–400.0)
RBC: 4.61 Mil/uL (ref 3.87–5.11)
RDW: 13.5 % (ref 11.5–15.5)
WBC: 6.9 10*3/uL (ref 4.0–10.5)

## 2017-09-20 LAB — COMPREHENSIVE METABOLIC PANEL
ALK PHOS: 78 U/L (ref 39–117)
ALT: 23 U/L (ref 0–35)
AST: 16 U/L (ref 0–37)
Albumin: 4.4 g/dL (ref 3.5–5.2)
BILIRUBIN TOTAL: 0.4 mg/dL (ref 0.2–1.2)
BUN: 9 mg/dL (ref 6–23)
CO2: 25 mEq/L (ref 19–32)
CREATININE: 0.61 mg/dL (ref 0.40–1.20)
Calcium: 8.8 mg/dL (ref 8.4–10.5)
Chloride: 106 mEq/L (ref 96–112)
GFR: 111.48 mL/min (ref >60.00)
GLUCOSE: 101 mg/dL — AB (ref 70–99)
Potassium: 3.7 mEq/L (ref 3.5–5.1)
Sodium: 140 mEq/L (ref 135–145)
TOTAL PROTEIN: 7.1 g/dL (ref 6.0–8.3)

## 2017-09-20 LAB — LIPID PANEL
CHOL/HDL RATIO: 4
Cholesterol: 164 mg/dL (ref 0–200)
HDL: 40.2 mg/dL (ref >39.00)
LDL CALC: 95 mg/dL (ref 0–99)
NONHDL: 123.97
TRIGLYCERIDES: 146 mg/dL (ref 0.0–149.0)
VLDL: 29.2 mg/dL (ref 0.0–40.0)

## 2017-09-20 LAB — HEMOGLOBIN A1C: Hgb A1c MFr Bld: 5.8 % (ref 4.6–6.5)

## 2017-09-20 LAB — TSH: TSH: 0.81 u[IU]/mL (ref 0.35–4.50)

## 2017-09-20 MED ORDER — METOPROLOL TARTRATE 50 MG PO TABS: ORAL_TABLET | ORAL | 1 refills | Status: DC

## 2017-09-20 NOTE — Patient Instructions (Addendum)
It was good to see you today!  I am sorry that your mom is so sick- let me know how I can help Please go back on your metoprolol- let's have you try a 1/2 tablet twice a day to better control your symptoms I am going to set you up for a heart monitor to see what your pulse is doing as you go about your day Please pick up a BP cuff so you can monitor your BP at home   I would like to do a complete physical exam for you- pap, breast exam, and you need a mammogram.  Please come and see me to take care of these things as soon as you can

## 2017-10-04 ENCOUNTER — Ambulatory Visit (INDEPENDENT_AMBULATORY_CARE_PROVIDER_SITE_OTHER): Payer: Self-pay

## 2017-10-04 DIAGNOSIS — I471 Supraventricular tachycardia: Secondary | ICD-10-CM

## 2017-10-08 ENCOUNTER — Ambulatory Visit
Admission: RE | Admit: 2017-10-08 | Discharge: 2017-10-08 | Disposition: A | Discharge status: Home / Self Care | Payer: Self-pay | Source: Ambulatory Visit | Attending: Internal Medicine | Admitting: Internal Medicine

## 2017-10-08 DIAGNOSIS — I471 Supraventricular tachycardia: Secondary | ICD-10-CM | POA: Insufficient documentation

## 2017-10-16 ENCOUNTER — Telehealth: Payer: Self-pay | Admitting: Family Medicine

## 2017-10-16 NOTE — Telephone Encounter (Signed)
Called pt to discuss her holter monitor result.     The patient was monitred for 24 hours.  The predominant rhythm was normal sinus with an average rate of 81 bpm (range 56-156 bpm). The longest R-R interval was 1.3 seconds.  Rare supraventricular and ventricular ectopy were observed.  There was a single atrial run lasting 6 beats with a maximal rate of 165 bpm.  No sustained arrhythmia or prolonged pause was identified.  Patient triggered events correspond to sinus tachycardia.   Predominantly sinus rhythm with rare PACs and PVCs. Single atrial run lasting 6 beats.  We started Anne Casey back on her lopressor recently and she does feel like this has reduced her palpitations.  She will continue to use this medication and will let me know if any other concerns.  Her mother is currently dying from metastatic colon cancer, which is very hard

## 2018-04-13 ENCOUNTER — Other Ambulatory Visit: Payer: Self-pay | Admitting: Family Medicine

## 2018-04-13 DIAGNOSIS — I471 Supraventricular tachycardia, unspecified: Secondary | ICD-10-CM

## 2018-04-13 DIAGNOSIS — I10 Essential (primary) hypertension: Secondary | ICD-10-CM

## 2019-01-07 ENCOUNTER — Telehealth: Payer: Self-pay

## 2019-01-07 ENCOUNTER — Other Ambulatory Visit: Payer: Self-pay | Admitting: Family Medicine

## 2019-01-07 DIAGNOSIS — I471 Supraventricular tachycardia: Secondary | ICD-10-CM

## 2019-01-07 DIAGNOSIS — I10 Essential (primary) hypertension: Secondary | ICD-10-CM

## 2019-01-07 MED ORDER — METOPROLOL TARTRATE 50 MG PO TABS: 50.0000 mg | ORAL_TABLET | Freq: Two times a day (BID) | ORAL | 0 refills | Status: DC

## 2019-01-07 NOTE — Telephone Encounter (Signed)
Refills sent

## 2019-01-07 NOTE — Telephone Encounter (Signed)
I would be glad to refer her.  However have not seen her since 2018, I would need to see her in the office if she can.  Or we can do a video or phone visit.  Please call her and see how she would like to proceed

## 2019-01-07 NOTE — Telephone Encounter (Signed)
Copied from CRM [id number]. Topic: Quick Communication - Rx Refill/Question >> Jan 07, 2019 12:24 PM Mcneil, Ja-Kwan wrote: Medication: metoprolol tartrate (LOPRESSOR) 50 MG tablet  Has the patient contacted their pharmacy? yes   Preferred Pharmacy (with phone number or street name): [id number] [place], [place] - [address] AT [facility] OF [address] [phone] (Phone)  [phone] (Fax)  Agent: Please be advised that RX refills may take up to 3 business days. We ask that you follow-up with your pharmacy.

## 2019-01-07 NOTE — Telephone Encounter (Signed)
Copied from CRM [id number]. Topic: Referral - Request for Referral >> Jan 07, 2019 12:27 PM Marylen Ponto wrote: Has patient seen PCP for this complaint? yes  *If NO, is insurance requiring patient see PCP for this issue before PCP can refer them? Referral for which specialty: Cardiologist Preferred provider/office: no specific provider/[place] Reason for referral: Pt stated she was told she would need to see a cardiologist but she did not have insurance but now she is working and would like the referral to be submitted

## 2019-01-08 ENCOUNTER — Encounter: Payer: Self-pay | Admitting: Family Medicine

## 2019-01-08 ENCOUNTER — Ambulatory Visit (INDEPENDENT_AMBULATORY_CARE_PROVIDER_SITE_OTHER): Payer: BLUE CROSS/BLUE SHIELD | Admitting: Family Medicine

## 2019-01-08 ENCOUNTER — Other Ambulatory Visit: Payer: Self-pay

## 2019-01-08 DIAGNOSIS — H60393 Other infective otitis externa, bilateral: Secondary | ICD-10-CM

## 2019-01-08 DIAGNOSIS — I10 Essential (primary) hypertension: Secondary | ICD-10-CM

## 2019-01-08 DIAGNOSIS — I471 Supraventricular tachycardia, unspecified: Secondary | ICD-10-CM

## 2019-01-08 MED ORDER — NEOMYCIN-POLYMYXIN-HC 3.5-10000-1 OT SOLN: 4.0000 [drp] | Freq: Three times a day (TID) | OTIC | 0 refills | Status: DC

## 2019-01-08 MED ORDER — METOPROLOL TARTRATE 25 MG PO TABS: 25.0000 mg | ORAL_TABLET | Freq: Two times a day (BID) | ORAL | 0 refills | Status: DC

## 2019-01-08 NOTE — Progress Notes (Signed)
[facility] [address] [place], [place] [address] [phone]  Date:  01/08/2019   Name:  Anne Casey   DOB:  07-11-1971   MRN:  [id number]  PCP:  Pearline Cables, MD    Chief Complaint: No chief complaint on file.   History of Present Illness:  Anne Casey is a 48 y.o. very pleasant female patient who presents with the following:  Did phone visit with pt today- we tried video visit but it did not work I last saw her a little over a year ago, at that time were treated with metoprolol for palpitations. We did do a Holter monitor for her in December 2018:   The patient was monitred for 24 hours.  The predominant rhythm was normal sinus with an average rate of 81 bpm (range 56-156 bpm). The longest R-R interval was 1.3 seconds.  Rare supraventricular and ventricular ectopy were observed.  There was a single atrial run lasting 6 beats with a maximal rate of 165 bpm.  No sustained arrhythmia or prolonged pause was identified.  Patient triggered events correspond to sinus tachycardia.   Predominantly sinus rhythm with rare PACs and PVCs. Single atrial run lasting 6 beats.   She ran out of her metoprolol about a year ago-would now like to start back on it Since stopping metoprolol she has noted more issues with her HR being high- this is not really new, but she just now got insurance and would like to restart meds and see cardiology  She is not checking her BP Her brother did check her pulse a few weeks ago, "it was high, maybe 150" but we are not sure the exact number.  She had symptoms at that time and felt palpiations.  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  It is felt tender since then, she  thinks she may be getting swimmer's ear again.  She has had this in the past.  No hearing change or bleeding to suggest tympanic membrane rupture She wonders if I can refill her eardrops that she is used in the past.  This is fine with me  Patient Active Problem List   Diagnosis Date Noted  . Palpitation   . SVT (supraventricular tachycardia) (HCC) 08/09/2012    Past Medical History:  Diagnosis Date  . Palpitation   . SVT (supraventricular tachycardia) (HCC)     No past surgical history on file.  Social History   Tobacco Use  . Smoking status: Never Smoker  . Smokeless tobacco: Never Used  Substance Use Topics  . Alcohol use: No  . Drug use: No    No family history on file.  No Known Allergies  Medication list has been reviewed and updated.  Current Outpatient Medications on File Prior to Visit  Medication Sig Dispense Refill  . metoprolol tartrate (LOPRESSOR) 50 MG tablet Take 1 tablet (50 mg total) by mouth 2 (two) times daily. 60 tablet 0   No current facility-administered medications on file prior to visit.     Review of Systems:  Asper   Physical Examination: There were no vitals filed for this visit. There were no vitals filed  for this visit. There is no height or weight on file to calculate BMI. Ideal Body Weight:    Patient sounds well, no distress or shortness of breath apparent  Assessment and Plan: Essential hypertension  SVT (supraventricular tachycardia) (HCC)  Otitis, externa, infective, bilateral  Recent absence from care due to lack of insurance.  Patient now has insurance again, would like to restart her metoprolol and also see cardiology.  This is reasonable.  I will have her start back on Lopressor 25 twice daily.  Referral made to cardiology  We will refill her eardrops for her today Spoke with pt for less than 10 minutes today  BP Readings from Last 3 Encounters:  09/20/17 140/82  12/06/16 140/84  05/20/16 115/79   Pulse  Readings from Last 3 Encounters:  09/20/17 82  12/06/16 75  05/20/16 79     Meds ordered this encounter  Medications  . metoprolol tartrate (LOPRESSOR) 25 MG tablet    Sig: Take 1 tablet (25 mg total) by mouth 2 (two) times daily.    Dispense:  180 tablet    Refill:  0  . neomycin-polymyxin-hydrocortisone (CORTISPORIN) OTIC solution    Sig: Place 4 drops into the right ear 3 (three) times daily. Use for one week    Dispense:  10 mL    Refill:  0     Signed Abbe Amsterdam, MD

## 2019-01-08 NOTE — Telephone Encounter (Signed)
Patient has been scheduled for webex appointment today-however no email verified yesterday when appt was scheduled, no email in chart. I tried calling the patient, no answer and voicemail box full.

## 2019-01-09 NOTE — Telephone Encounter (Signed)
Pt had successful webex video chat today with provider.

## 2019-01-15 ENCOUNTER — Emergency Department (HOSPITAL_COMMUNITY): Payer: BLUE CROSS/BLUE SHIELD

## 2019-01-15 ENCOUNTER — Emergency Department (HOSPITAL_COMMUNITY)
Admission: EM | Admit: 2019-01-15 | Discharge: 2019-01-16 | Disposition: A | Discharge status: Home / Self Care | Payer: BLUE CROSS/BLUE SHIELD | Attending: Emergency Medicine | Admitting: Emergency Medicine

## 2019-01-15 ENCOUNTER — Encounter (HOSPITAL_COMMUNITY): Payer: Self-pay | Admitting: Emergency Medicine

## 2019-01-15 ENCOUNTER — Other Ambulatory Visit: Payer: Self-pay

## 2019-01-15 DIAGNOSIS — Z7982 Long term (current) use of aspirin: Secondary | ICD-10-CM | POA: Insufficient documentation

## 2019-01-15 DIAGNOSIS — Z79899 Other long term (current) drug therapy: Secondary | ICD-10-CM | POA: Insufficient documentation

## 2019-01-15 DIAGNOSIS — R000 Tachycardia, unspecified: Secondary | ICD-10-CM | POA: Diagnosis present

## 2019-01-15 DIAGNOSIS — R002 Palpitations: Secondary | ICD-10-CM

## 2019-01-15 DIAGNOSIS — I471 Supraventricular tachycardia: Secondary | ICD-10-CM

## 2019-01-15 HISTORY — DX: Other specified postprocedural states: Z98.890

## 2019-01-15 LAB — CBC WITH DIFFERENTIAL/PLATELET
Abs Immature Granulocytes: 0.03 10*3/uL (ref 0.00–0.07)
Basophils Absolute: 0 10*3/uL (ref 0.0–0.1)
Basophils Relative: 0 %
Eosinophils Absolute: 0.1 10*3/uL (ref 0.0–0.5)
Eosinophils Relative: 1 %
HCT: 42.4 % (ref 36.0–46.0)
Hemoglobin: 13.7 g/dL (ref 12.0–15.0)
Immature Granulocytes: 0 %
Lymphocytes Relative: 16 %
Lymphs Abs: 1.5 10*3/uL (ref 0.7–4.0)
MCH: 28.9 pg (ref 26.0–34.0)
MCHC: 32.3 g/dL (ref 30.0–36.0)
MCV: 89.5 fL (ref 80.0–100.0)
Monocytes Absolute: 0.6 10*3/uL (ref 0.1–1.0)
Monocytes Relative: 6 %
Neutro Abs: 7.2 10*3/uL (ref 1.7–7.7)
Neutrophils Relative %: 77 %
Platelets: 230 10*3/uL (ref 150–400)
RBC: 4.74 MIL/uL (ref 3.87–5.11)
RDW: 12.9 % (ref 11.5–15.5)
WBC: 9.3 10*3/uL (ref 4.0–10.5)
nRBC: 0 % (ref 0.0–0.2)

## 2019-01-15 LAB — BASIC METABOLIC PANEL
Anion gap: 8 (ref 5–15)
BUN: 11 mg/dL (ref 6–20)
CO2: 26 mmol/L (ref 22–32)
Calcium: 8.9 mg/dL (ref 8.9–10.3)
Chloride: 105 mmol/L (ref 98–111)
Creatinine, Ser: 0.78 mg/dL (ref 0.44–1.00)
GFR calc Af Amer: >60 mL/min (ref >60)
GFR calc non Af Amer: >60 mL/min (ref >60)
Glucose, Bld: 133 mg/dL — ABNORMAL HIGH (ref 70–99)
Potassium: 3.8 mmol/L (ref 3.5–5.1)
Sodium: 139 mmol/L (ref 135–145)

## 2019-01-15 LAB — D-DIMER, QUANTITATIVE (NOT AT ARMC): D-Dimer, Quant: 1.51 ug/mL-FEU — ABNORMAL HIGH (ref 0.00–0.50)

## 2019-01-15 NOTE — ED Provider Notes (Signed)
[facility] EMERGENCY DEPARTMENT Provider Note   CSN: [id number] Arrival date & time: 01/15/19  2200    History   Chief Complaint Chief Complaint  Patient presents with  . Tachycardia    HPI Anne Casey is a 48 y.o. female.     Patient presents via EMS with episode of palpitations.  States she was in her normal state of health and had a regular day but while she was washing the dishes around 9 PM she developed palpitations and racing heart.  She has had this in the past and been diagnosed with SVT.  She took a leftover carvedilol but symptoms persisted for about the past hour and EMS was called.  She was given 6 mg of adenosine for apparent SVT the rhythm strips are not available. EMS reports rate was 202.  She feels back to baseline now.  Patient states she was recently restarted on her metoprolol after being off of it for several months and has been on it for about 2 weeks now.  She denies having any chest pain.  She did have palpitations and shortness of breath and dizziness which have since resolved.  Denies any alcohol or excessive caffeine use.  Her last dose of metoprolol was this morning. She had a reassuring Holter monitor study in 2018 but has not seen cardiology regarding her palpations and SVT.   The history is provided by the patient.    Past Medical History:  Diagnosis Date  . History of Holter monitoring 09/2017   sinus tachycardia  . Palpitation   . SVT (supraventricular tachycardia) [facility])     Patient Active Problem List   Diagnosis Date Noted  . Palpitation   . SVT (supraventricular tachycardia) (HCC) 08/09/2012    No past surgical history on file.   OB History   No obstetric history on file.      Home Medications    Prior to Admission medications   Medication Sig Start Date End Date Taking? Authorizing Provider  aspirin 81 MG chewable tablet Chew 324 mg by mouth once as needed (for chest pain).   Yes [provider]  metoprolol  tartrate (LOPRESSOR) 25 MG tablet Take 1 tablet (25 mg total) by mouth 2 (two) times daily. 01/08/19  Yes Copland, Gwenlyn Found, MD  Multiple Vitamin (MULTIVITAMIN WITH MINERALS) TABS tablet Take 1 tablet by mouth daily.   Yes [provider]  neomycin-polymyxin-hydrocortisone (CORTISPORIN) OTIC solution Place 4 drops into the right ear 3 (three) times daily. Use for one week 01/08/19  Yes Copland, Gwenlyn Found, MD  Omega-3 Fatty Acids (FISH OIL) 1000 MG CAPS Take by mouth.   Yes [provider]  VITAMIN D PO Take 1 capsule by mouth daily.   Yes [provider]    Family History No family history on file.  Social History Social History   Tobacco Use  . Smoking status: Never Smoker  . Smokeless tobacco: Never Used  Substance Use Topics  . Alcohol use: No  . Drug use: No     Allergies   Patient has no known allergies.   Review of Systems Review of Systems  Constitutional: Negative for activity change, appetite change and fever.  HENT: Negative for congestion and rhinorrhea.   Respiratory: Positive for shortness of breath. Negative for cough and chest tightness.   Cardiovascular: Positive for palpitations. Negative for chest pain.  Gastrointestinal: Negative for abdominal pain, nausea and vomiting.  Genitourinary: Negative for dysuria and hematuria.  Musculoskeletal: Negative for  arthralgias and myalgias.  Skin: Negative for rash.  Neurological: Positive for dizziness and light-headedness. Negative for weakness.   all other systems are negative except as noted in the HPI and PMH.     Physical Exam Updated Vital Signs BP 138/88 (BP Location: Right Arm)   Pulse 100   Temp 97.9 F (36.6 C) (Oral)   Resp 15   Ht  (1.676 m)   Wt 115.2 kg   LMP 01/08/2019 (Approximate)   SpO2 98%   BMI 41.00 kg/m   Physical Exam Vitals signs and nursing note reviewed.  Constitutional:      General: She is not in acute distress.    Appearance: She is  well-developed.  HENT:     Head: Normocephalic and atraumatic.     Mouth/Throat:     Pharynx: No oropharyngeal exudate.  Eyes:     Conjunctiva/sclera: Conjunctivae normal.     Pupils: Pupils are equal, round, and reactive to light.  Neck:     Musculoskeletal: Normal range of motion and neck supple.     Comments: No meningismus. Cardiovascular:     Rate and Rhythm: Regular rhythm. Tachycardia present.     Heart sounds: Normal heart sounds. No murmur.     Comments: Sinus 100s Pulmonary:     Effort: Pulmonary effort is normal. No respiratory distress.     Breath sounds: Normal breath sounds.  Chest:     Chest wall: No tenderness.  Abdominal:     Palpations: Abdomen is soft.     Tenderness: There is no abdominal tenderness. There is no guarding or rebound.  Musculoskeletal: Normal range of motion.        General: No tenderness.  Skin:    General: Skin is warm.  Neurological:     Mental Status: She is alert and oriented to person, place, and time.     Cranial Nerves: No cranial nerve deficit.     Motor: No abnormal muscle tone.     Coordination: Coordination normal.     Comments:  5/5 strength throughout. CN 2-12 intact.Equal grip strength.   Psychiatric:        Behavior: Behavior normal.      ED Treatments / Results  Labs (all labs ordered are listed, but only abnormal results are displayed) Labs Reviewed  BASIC METABOLIC PANEL - Abnormal; Notable for the following components:      Result Value   Glucose, Bld 133 (*)    All other components within normal limits  D-DIMER, QUANTITATIVE (NOT AT [facility]) - Abnormal; Notable for the following components:   D-Dimer, Quant 1.51 (*)    All other components within normal limits  URINALYSIS, ROUTINE W REFLEX MICROSCOPIC - Abnormal; Notable for the following components:   APPearance HAZY (*)    Ketones, ur 5 (*)    Leukocytes,Ua TRACE (*)    Bacteria, UA RARE (*)    All other components within normal limits  TROPONIN I -  Abnormal; Notable for the following components:   Troponin I 0.03 (*)    All other components within normal limits  CBC WITH DIFFERENTIAL/PLATELET  TROPONIN I    EKG EKG Interpretation  Date/Time:  Wednesday January 15 2019 22:02:38 EDT Ventricular Rate:  101 PR Interval:    QRS Duration: 75 QT Interval:  342 QTC Calculation: 444 R Axis:   82 Text Interpretation:  Sinus tachycardia Biatrial enlargement Probable left ventricular hypertrophy No significant change was found Confirmed by Glynn Octave [phone]) on 01/15/2019 10:51:55  PM   Radiology Dg Chest 2 View  Result Date: 01/15/2019 CLINICAL DATA:  48 year old female with shortness of breath. EXAM: CHEST - 2 VIEW COMPARISON:  Chest radiograph dated 05/20/2016 FINDINGS: The heart size and mediastinal contours are within normal limits. Both lungs are clear. The visualized skeletal structures are unremarkable. IMPRESSION: No active cardiopulmonary disease. Electronically Signed   By: Elgie Collard M.D.   On: 01/15/2019 23:56   Ct Angio Chest Pe W And/or Wo Contrast  Result Date: 01/16/2019 CLINICAL DATA:  48 year old female with chest pressure and shortness of breath. Concern for pulmonary embolism. EXAM: CT ANGIOGRAPHY CHEST WITH CONTRAST TECHNIQUE: Multidetector CT imaging of the chest was performed using the standard protocol during bolus administration of intravenous contrast. Multiplanar CT image reconstructions and MIPs were obtained to evaluate the vascular anatomy. CONTRAST:  OMNIPAQUE IOHEXOL 350 MG/ML SOLN COMPARISON:  Chest radiograph dated 01/15/2019 FINDINGS: Cardiovascular: Borderline cardiomegaly. No pericardial effusion. The thoracic aorta is unremarkable. The origins of the great vessels of the aortic arch appear patent. Evaluation of the pulmonary arteries is somewhat limited due to respiratory motion artifact and suboptimal opacification and visualization of the peripheral branches. No definite pulmonary artery  embolus identified. Mediastinum/Nodes: There is no hilar or mediastinal adenopathy. The esophagus and the thyroid gland are grossly unremarkable. No mediastinal fluid collection or hematoma. Lungs/Pleura: The lungs are clear. There is a 4 mm calcified left upper lobe subpleural granuloma. There is no pleural effusion or pneumothorax. The central airways are patent. Upper Abdomen: Diffuse fatty infiltration of the liver. The visualized upper abdomen is otherwise unremarkable. Musculoskeletal: Degenerative changes and vacuum phenomena at the left sternoclavicular joint. No acute osseous pathology. Review of the MIP images confirms the above findings. IMPRESSION: 1. No acute intrathoracic pathology. No CT evidence of pulmonary embolism. 2. Fatty liver. Electronically Signed   By: Elgie Collard M.D.   On: 01/16/2019 01:28    Procedures Procedures (including critical care time)  Medications Ordered in ED Medications - No data to display   Initial Impression / Assessment and Plan / ED Course  I have reviewed the triage vital signs and the nursing notes.  Pertinent labs & imaging results that were available during my care of the patient were reviewed by me and considered in my medical decision making (see chart for details).       Episode of palpitations with shortness of breath and dizziness which is since resolved.  Suspect SVT as EMS converted her with adenosine but there are no rhythm strips available. Rate was 202 by report.   Patient feels improved.  No shortness of breath, dizziness or lightheadedness throughout ED stay. Labs are reassuring. D-dimer was elevated but subsequent CT PE study is negative.  No hypoxia.  Troponins are reassuring.  Patient never did have chest pain but did have some tightness associated with her shortness of breath and palpitations.  She will continue her metoprolol and follow-up with cardiology and PCP.  Advised she should not take carvedilol in addition to  her metoprolol. Return precautions discussed.  Final Clinical Impressions(s) / ED Diagnoses   Final diagnoses:  SVT (supraventricular tachycardia) [facility])  Palpitations    ED Discharge Orders    None       Bryanda Mikel, Jeannett Senior, MD 01/16/19 0403

## 2019-01-15 NOTE — ED Triage Notes (Signed)
EMS report patient was washing dishes and started having chest pressure and shortness of breath. EMS arrived on scene to find patient in SVT. EMS gave 6 mg's of adenosine and patient converted. Patients current heart rate is 100. Patient denies any chest pain at this time. Patient also took 324 of ASA at home. Patient conscious, alert and oriented.

## 2019-01-16 ENCOUNTER — Emergency Department (HOSPITAL_COMMUNITY): Payer: BLUE CROSS/BLUE SHIELD

## 2019-01-16 DIAGNOSIS — I471 Supraventricular tachycardia: Secondary | ICD-10-CM | POA: Diagnosis not present

## 2019-01-16 LAB — URINALYSIS, ROUTINE W REFLEX MICROSCOPIC
Bilirubin Urine: NEGATIVE
Glucose, UA: NEGATIVE mg/dL
Hgb urine dipstick: NEGATIVE
Ketones, ur: 5 mg/dL — AB
Nitrite: NEGATIVE
Protein, ur: NEGATIVE mg/dL
Specific Gravity, Urine: 1.009 (ref 1.005–1.030)
pH: 6 (ref 5.0–8.0)

## 2019-01-16 LAB — TROPONIN I
Troponin I: <0.03 ng/mL (ref <0.03)
Troponin I: 0.03 ng/mL (ref <0.03)

## 2019-01-16 MED ORDER — IOHEXOL 350 MG/ML SOLN
100.0000 mL | Freq: Once | INTRAVENOUS | Status: AC | PRN
  Administered 2019-01-16: 100 mL via INTRAVENOUS

## 2019-01-16 NOTE — Discharge Instructions (Signed)
Continue your metoprolol as prescribed.  Do not take carvedilol any longer.  Follow-up with your primary doctor as well as a cardiologist.  Return to the ED with chest pain, shortness of breath, any other concerns.

## 2019-04-14 ENCOUNTER — Other Ambulatory Visit: Payer: Self-pay | Admitting: Family Medicine

## 2019-04-14 DIAGNOSIS — I471 Supraventricular tachycardia: Secondary | ICD-10-CM

## 2019-04-14 DIAGNOSIS — I10 Essential (primary) hypertension: Secondary | ICD-10-CM

## 2019-04-25 ENCOUNTER — Telehealth: Payer: Self-pay

## 2019-04-25 NOTE — Telephone Encounter (Signed)
Copied from [place] [phone]. Topic: General - Other >> Apr 14, 2019 12:25 PM Rainey Pines A wrote: Patient would like a callback from Dr. Lorelei Pont nurse in regards to a referral to cardiology that she has been waiting on since March.

## 2019-04-29 NOTE — Progress Notes (Addendum)
[facility] [address], [address] [place], [place] [address] [phone] [phone]  Date:  04/30/2019   Name:  Anne Casey   DOB:  Sep 26, 1971   MRN:  [id number]  PCP:  Pearline Cablesopland, Paxtyn Boyar C, MD    Chief Complaint: Medication Refill   History of Present Illness:  Anne Casey is a 48 y.o. very pleasant female patient who presents with the following:  Here today for periodic follow-up visit and medication check We did a virtual visit in March  I had also cared for this patient's mother, who died in January 2019 due to metastatic colon cancer History of palpitations and supraventricular tachycardia She [facility] [place]-she was thought to have SVT, converted with adenosine per EMS.  She was evaluated and released.  She did have one more episode following [facility]- 3 or 4 weeks ago but this just lasted for about 10 minutes She took metoprolol and it resolved She is taking metoprolol BID now- she feels like she is more functional and better with this medication in general   She is due for follow-up visit to cardiology, I referred her back in March but this did not happen- likely due to COVID pandemic.  Se She does have an appt [place] in [place] but this is not for another month.  She wonders if she might be able to be seen sooner in [place]  Anne Casey has typically declined a lot of screening services. Asked again today-  She declines a mammogram Sh declines a pap today  Declines a tetanus   She is working 3rd shift right now and does not sleep well - she is now working at [employer].  They hope to be able to move her to first or second shift soon as this would be better for her health   She does get what sounds like migraine headaches on occasion- if she has one of these, she will tend to get palpitations the next day  She occasionally will notice an ache in her chest but not present  now.  Last noticed a few weeks ago    Patient Active Problem List   Diagnosis Date Noted  . Palpitation   . SVT (supraventricular tachycardia) (HCC) 08/09/2012    Past Medical History:  Diagnosis Date  . History of Holter monitoring 09/2017   sinus tachycardia  . Palpitation   . SVT (supraventricular tachycardia) (HCC)     No past surgical history on file.  Social History   Tobacco Use  . Smoking status: Never Smoker  . Smokeless tobacco: Never Used  Substance Use Topics  . Alcohol use: No  . Drug use: No    No family history on file.  No Known Allergies  Medication list has been reviewed and updated.  Current Outpatient Medications on File Prior to Visit  Medication Sig Dispense Refill  . aspirin 81 MG chewable tablet Chew 324 mg by mouth once as needed (for chest pain).    . metoprolol tartrate (LOPRESSOR) 25 MG tablet TAKE 1 TABLET(25 MG) BY MOUTH TWICE DAILY 180 tablet 1  . Multiple Vitamin (MULTIVITAMIN WITH MINERALS) TABS tablet Take 1 tablet by mouth daily.    Marland Kitchen. neomycin-polymyxin-hydrocortisone (CORTISPORIN) OTIC solution Place 4 drops into the right ear 3 (three) times daily. Use for one week 10 mL 0  . Omega-3 Fatty Acids (Casey OIL)  1000 MG CAPS Take by mouth.    Marland Kitchen VITAMIN D PO Take 1 capsule by mouth daily.     No current facility-administered medications on file prior to visit.     Review of Systems:  As per HPI- otherwise negative. No fever or chills   Physical Examination: Vitals:   04/30/19 1313  BP: 126/80  Pulse: 70  Resp: 18  Temp: 98.2 F (36.8 C)  SpO2: 99%   Vitals:   04/30/19 1313  Weight: 250 lb (113.4 kg)  Height: 5\' 6"  (1.676 m)   Body mass index is 40.35 kg/m. Ideal Body Weight: Weight in (lb) to have BMI = 25: 154.6  GEN: WDWN, NAD, Non-toxic, A & O x 3, obese, looks well  HEENT: Atraumatic, Normocephalic. Neck supple. No masses, No LAD. Ears and Nose: No external deformity. CV: RRR, No M/G/R. No JVD. No thrill. No  extra heart sounds.  Currently in sinus rhythm PULM: CTA B, no wheezes, crackles, rhonchi. No retractions. No resp. distress. No accessory muscle use. EXTR: No c/c/e NEURO Normal gait.  PSYCH: Normally interactive. Conversant. Not depressed or anxious appearing.  Calm demeanor.  She also notes varicose veins in her calves which can be uncomfortable for her   Assessment and Plan:   ICD-10-CM   1. Varicose veins during pregnancy, antepartum  O22.00 Ambulatory referral to Vascular Surgery  2. Essential hypertension  I10 metoprolol tartrate (LOPRESSOR) 25 MG tablet  3. SVT (supraventricular tachycardia) (HCC)  I47.1 metoprolol tartrate (LOPRESSOR) 25 MG tablet    Ambulatory referral to Cardiology    TSH  4. Screening for deficiency anemia  Z13.0 CBC  5. Screening for diabetes mellitus  Z13.1 Comprehensive metabolic panel    Hemoglobin A1c  6. Screening for hyperlipidemia  Z13.220 Lipid panel   Following up intermittent SVT.  She has not been to see cardiology Will refer her to office here in [place] in case they can see her sooner For now continue metoprolol Other routine labs and TSH pending today Referral to vascular regarding her varicose veins   Follow-up: No follow-ups on file.  Meds ordered this encounter  Medications  . metoprolol tartrate (LOPRESSOR) 25 MG tablet    Sig: TAKE 1 TABLET(25 MG) BY MOUTH TWICE DAILY    Dispense:  180 tablet    Refill:  1   Orders Placed This Encounter  Procedures  . CBC  . Comprehensive metabolic panel  . Hemoglobin A1c  . Lipid panel  . TSH  . Ambulatory referral to Cardiology  . Ambulatory referral to Vascular Surgery    @SIGN @    Signed Lamar Blinks, MD Received her labs, letter to pt  Results for orders placed or performed in visit on 04/30/19  CBC  Result Value Ref Range   WBC 8.6 4.0 - 10.5 K/uL   RBC 4.59 3.87 - 5.11 Mil/uL   Platelets 229.0 150.0 - 400.0 K/uL   Hemoglobin 13.3 12.0 - 15.0 g/dL   HCT 40.0 36.0 - 46.0 %    MCV 87.2 78.0 - 100.0 fl   MCHC 33.2 30.0 - 36.0 g/dL   RDW 13.6 11.5 - 15.5 %  Comprehensive metabolic panel  Result Value Ref Range   Sodium 139 135 - 145 mEq/L   Potassium 3.9 3.5 - 5.1 mEq/L   Chloride 105 96 - 112 mEq/L   CO2 25 19 - 32 mEq/L   Glucose, Bld 107 (H) 70 - 99 mg/dL   BUN 13 6 - 23 mg/dL  Creatinine, Ser 0.63 0.40 - 1.20 mg/dL   Total Bilirubin 0.5 0.2 - 1.2 mg/dL   Alkaline Phosphatase 78 39 - 117 U/L   AST 12 0 - 37 U/L   ALT 15 0 - 35 U/L   Total Protein 7.2 6.0 - 8.3 g/dL   Albumin 4.4 3.5 - 5.2 g/dL   Calcium 9.1 8.4 - 16.110.5 mg/dL   GFR 096.04100.37 >54.09>60.00 mL/min  Hemoglobin A1c  Result Value Ref Range   Hgb A1c MFr Bld 5.5 4.6 - 6.5 %  Lipid panel  Result Value Ref Range   Cholesterol 176 0 - 200 mg/dL   Triglycerides 811.9181.0 (H) 0.0 - 149.0 mg/dL   HDL 14.7843.10 >29.56>39.00 mg/dL   VLDL 21.336.2 0.0 - 08.640.0 mg/dL   LDL Cholesterol 96 0 - 99 mg/dL   Total CHOL/HDL Ratio 4    NonHDL 132.40   TSH  Result Value Ref Range   TSH 1.14 0.35 - 4.50 uIU/mL

## 2019-04-30 ENCOUNTER — Other Ambulatory Visit: Payer: Self-pay

## 2019-04-30 ENCOUNTER — Encounter: Payer: Self-pay | Admitting: Family Medicine

## 2019-04-30 ENCOUNTER — Ambulatory Visit (INDEPENDENT_AMBULATORY_CARE_PROVIDER_SITE_OTHER): Payer: BC Managed Care – PPO | Admitting: Family Medicine

## 2019-04-30 VITALS — BP 126/80 | HR 70 | Temp 98.2°F | Resp 18 | Ht 66.0 in | Wt 250.0 lb

## 2019-04-30 DIAGNOSIS — I10 Essential (primary) hypertension: Secondary | ICD-10-CM | POA: Diagnosis not present

## 2019-04-30 DIAGNOSIS — I471 Supraventricular tachycardia, unspecified: Secondary | ICD-10-CM

## 2019-04-30 DIAGNOSIS — Z131 Encounter for screening for diabetes mellitus: Secondary | ICD-10-CM | POA: Diagnosis not present

## 2019-04-30 DIAGNOSIS — Z130 Encounter for screening for diseases of the blood and blood-forming organs and certain disorders involving the immune mechanism: Secondary | ICD-10-CM

## 2019-04-30 DIAGNOSIS — O2200 Varicose veins of lower extremity in pregnancy, unspecified trimester: Secondary | ICD-10-CM | POA: Diagnosis not present

## 2019-04-30 DIAGNOSIS — Z13220 Encounter for screening for lipoid disorders: Secondary | ICD-10-CM

## 2019-04-30 DIAGNOSIS — I83813 Varicose veins of bilateral lower extremities with pain: Secondary | ICD-10-CM

## 2019-04-30 LAB — COMPREHENSIVE METABOLIC PANEL
ALT: 15 U/L (ref 0–35)
AST: 12 U/L (ref 0–37)
Albumin: 4.4 g/dL (ref 3.5–5.2)
Alkaline Phosphatase: 78 U/L (ref 39–117)
BUN: 13 mg/dL (ref 6–23)
CO2: 25 mEq/L (ref 19–32)
Calcium: 9.1 mg/dL (ref 8.4–10.5)
Chloride: 105 mEq/L (ref 96–112)
Creatinine, Ser: 0.63 mg/dL (ref 0.40–1.20)
GFR: 100.37 mL/min (ref >60.00)
Glucose, Bld: 107 mg/dL — ABNORMAL HIGH (ref 70–99)
Potassium: 3.9 mEq/L (ref 3.5–5.1)
Sodium: 139 mEq/L (ref 135–145)
Total Bilirubin: 0.5 mg/dL (ref 0.2–1.2)
Total Protein: 7.2 g/dL (ref 6.0–8.3)

## 2019-04-30 LAB — LIPID PANEL
Cholesterol: 176 mg/dL (ref 0–200)
HDL: 43.1 mg/dL (ref >39.00)
LDL Cholesterol: 96 mg/dL (ref 0–99)
NonHDL: 132.4
Total CHOL/HDL Ratio: 4
Triglycerides: 181 mg/dL — ABNORMAL HIGH (ref 0.0–149.0)
VLDL: 36.2 mg/dL (ref 0.0–40.0)

## 2019-04-30 LAB — CBC
HCT: 40 % (ref 36.0–46.0)
Hemoglobin: 13.3 g/dL (ref 12.0–15.0)
MCHC: 33.2 g/dL (ref 30.0–36.0)
MCV: 87.2 fl (ref 78.0–100.0)
Platelets: 229 10*3/uL (ref 150.0–400.0)
RBC: 4.59 Mil/uL (ref 3.87–5.11)
RDW: 13.6 % (ref 11.5–15.5)
WBC: 8.6 10*3/uL (ref 4.0–10.5)

## 2019-04-30 LAB — HEMOGLOBIN A1C: Hgb A1c MFr Bld: 5.5 % (ref 4.6–6.5)

## 2019-04-30 LAB — TSH: TSH: 1.14 u[IU]/mL (ref 0.35–4.50)

## 2019-04-30 MED ORDER — METOPROLOL TARTRATE 25 MG PO TABS: ORAL_TABLET | ORAL | 1 refills | Status: DC

## 2019-04-30 NOTE — Patient Instructions (Signed)
Good to see you again today!  We will set you up with cardiology here at the [facility]- they can likely see you sooner I will be in touch with your labs, and will refer you to the Vascular and Vein clinic in [facility] to discuss your varicose veins  Please continue metoprolol for now and contact me if any change or worsening of your condition

## 2019-05-01 ENCOUNTER — Encounter: Payer: Self-pay | Admitting: Cardiology

## 2019-05-01 ENCOUNTER — Ambulatory Visit (INDEPENDENT_AMBULATORY_CARE_PROVIDER_SITE_OTHER): Payer: PRIVATE HEALTH INSURANCE | Admitting: Cardiology

## 2019-05-01 VITALS — BP 126/76 | HR 74 | Ht 66.0 in | Wt 250.0 lb

## 2019-05-01 DIAGNOSIS — R002 Palpitations: Secondary | ICD-10-CM

## 2019-05-01 DIAGNOSIS — I471 Supraventricular tachycardia: Secondary | ICD-10-CM | POA: Diagnosis not present

## 2019-05-01 NOTE — Progress Notes (Signed)
Cardiology Office Note:    Date:  05/01/2019   ID:  Anne Casey, DOB [birth year], MRN [id number]  PCP:  Darreld Mclean, MD  Cardiologist:  Jenean Lindau, MD   Referring MD: Darreld Mclean, MD    ASSESSMENT:    1. SVT (supraventricular tachycardia) (HCC)   2. Palpitation    PLAN:    In order of problems listed above:  1. Supraventricular tachycardia: She has had a longstanding history of this and her symptoms are recurring.  Recent TSH was unremarkable.  She is tolerating beta-blocker therapy well but wants something that can help her palpitations from happening.  I discussed with her flecainide therapy and she is agreeable.  For this reason initially I would like to make sure she has no ischemic heart disease.  We will do a Lexiscan sestamibi.  Echocardiogram will be done to assess murmur heard on auscultation.  If these are unremarkable then I should start her on flecainide 50 mg twice a day when I see her in 3 weeks in follow-up appointment.  She knows to go to the nearest emergency room for any concerning symptoms.  She had multiple questions which were answered to her satisfaction.   Medication Adjustments/Labs and Tests Ordered: Current medicines are reviewed at length with the patient today.  Concerns regarding medicines are outlined above.  No orders of the defined types were placed in this encounter.  No orders of the defined types were placed in this encounter.    History of Present Illness:    Anne Casey is a 48 y.o. female who is being seen today for the evaluation of palpitations and supraventricular tachycardia at the request of Copland, Gay Filler, MD.  Patient is a pleasant 48 year old female.  She has no significant past medical history.  She has been diagnosed with supraventricular tachycardia.  She tells me that she has palpitations about 2-3 times a month which is pretty significant and getting worse.  Especially when she has times when she has  headaches it bothers her.  She saw her primary care physician for the same reason and was referred here.  No chest pain orthopnea or PND.  She will have some chest discomfort at times when she has palpitations.  At the time of my evaluation, the patient is alert awake oriented and in no distress.  No history of syncope.  Past Medical History:  Diagnosis Date   History of Holter monitoring 09/2017   sinus tachycardia   Palpitation    SVT (supraventricular tachycardia) ([place])     History reviewed. No pertinent surgical history.  Current Medications: Current Meds  Medication Sig   metoprolol tartrate (LOPRESSOR) 50 MG tablet Take 1 tablet by mouth 2 (two) times a day.   Multiple Vitamin (MULTIVITAMIN WITH MINERALS) TABS tablet Take 1 tablet by mouth daily.   Omega-3 Fatty Acids (FISH OIL) 1000 MG CAPS Take by mouth.   VITAMIN D PO Take 1 capsule by mouth daily.   [DISCONTINUED] metoprolol tartrate (LOPRESSOR) 25 MG tablet TAKE 1 TABLET(25 MG) BY MOUTH TWICE DAILY     Allergies:   Patient has no known allergies.   Social History   Socioeconomic History   Marital status: Single    Spouse name: Not on file   Number of children: Not on file   Years of education: Not on file   Highest education level: Not on file  Occupational History   Not on file  Social Needs   Financial  resource strain: Not on file   Food insecurity    Worry: Not on file    Inability: Not on file   Transportation needs    Medical: Not on file    Non-medical: Not on file  Tobacco Use   Smoking status: Never Smoker   Smokeless tobacco: Never Used  Substance and Sexual Activity   Alcohol use: No   Drug use: No   Sexual activity: Not on file  Lifestyle   Physical activity    Days per week: Not on file    Minutes per session: Not on file   Stress: Not on file  Relationships   Social connections    Talks on phone: Not on file    Gets together: Not on file    Attends religious  service: Not on file    Active member of club or organization: Not on file    Attends meetings of clubs or organizations: Not on file    Relationship status: Not on file  Other Topics Concern   Not on file  Social History Narrative   Not on file     Family History: The patient's family history is not on file.  ROS:   Please see the history of present illness.    All other systems reviewed and are negative.  EKGs/Labs/Other Studies Reviewed:    The following studies were reviewed today: EKG was unremarkable.  Sinus rhythm and nonspecific ST-T changes.   Recent Labs: 04/30/2019: ALT 15; BUN 13; Creatinine, Ser 0.63; Hemoglobin 13.3; Platelets 229.0; Potassium 3.9; Sodium 139; TSH 1.14  Recent Lipid Panel    Component Value Date/Time   CHOL 176 04/30/2019 1341   CHOL 188 12/06/2016 1155   TRIG 181.0 (H) 04/30/2019 1341   HDL 43.10 04/30/2019 1341   HDL 41 12/06/2016 1155   CHOLHDL 4 04/30/2019 1341   VLDL 36.2 04/30/2019 1341   LDLCALC 96 04/30/2019 1341   LDLCALC 108 (H) 12/06/2016 1155    Physical Exam:    VS:  BP 126/76 (BP Location: Left Arm, Patient Position: Sitting, Cuff Size: Normal)    Pulse 74    Ht 5\' 6"  (1.676 m)    Wt 250 lb (113.4 kg)    SpO2 98%    BMI 40.35 kg/m     Wt Readings from Last 3 Encounters:  05/01/19 250 lb (113.4 kg)  04/30/19 250 lb (113.4 kg)  01/15/19 254 lb (115.2 kg)     GEN: Patient is in no acute distress HEENT: Normal NECK: No JVD; No carotid bruits LYMPHATICS: No lymphadenopathy CARDIAC: S1 S2 regular, 2/6 systolic murmur at the apex. RESPIRATORY:  Clear to auscultation without rales, wheezing or rhonchi  ABDOMEN: Soft, non-tender, non-distended MUSCULOSKELETAL:  No edema; No deformity  SKIN: Warm and dry NEUROLOGIC:  Alert and oriented x 3 PSYCHIATRIC:  Normal affect    Signed, Garwin Brothersajan R Anthonio Mizzell, MD  05/01/2019 11:02 AM    [facility]

## 2019-05-01 NOTE — Patient Instructions (Signed)
Medication Instructions:  Your physician recommends that you continue on your current medications as directed. Please refer to the Current Medication list given to you today.  If you need a refill on your cardiac medications before your next appointment, please call your pharmacy.   Lab work:NONE  If you have labs (blood work) drawn today and your tests are completely normal, you will receive your results only by:  MyChart Message (if you have MyChart) OR  A paper copy in the mail If you have any lab test that is abnormal or we need to change your treatment, we will call you to review the results.  Testing/Procedures: Your physician has requested that you have an echocardiogram. Echocardiography is a painless test that uses sound waves to create images of your heart. It provides your doctor with information about the size and shape of your heart and how well your hearts chambers and valves are working. This procedure takes approximately one hour. There are no restrictions for this procedure.  Your physician has requested that you have a lexiscan myoview. For further information please visit [email or web address]. Please follow instruction sheet, as given.  Cardiac Nuclear Scan A cardiac nuclear scan is a test that measures blood flow to the heart when a person is resting and when he or she is exercising. The test looks for problems such as:  Not enough blood reaching a portion of the heart.  The heart muscle not working normally. You may need this test if:  You have heart disease.  You have had abnormal lab results.  You have had heart surgery or a balloon procedure to open up blocked arteries (angioplasty).  You have chest pain.  You have shortness of breath. In this test, a radioactive dye (tracer) is injected into your bloodstream. After the tracer has traveled to your heart, an imaging device is used to measure how much of the tracer is absorbed by or distributed to various  areas of your heart. This procedure is usually done at a hospital and takes 2-4 hours. Tell a health care provider about:  Any allergies you have.  All medicines you are taking, including vitamins, herbs, eye drops, creams, and over-the-counter medicines.  Any problems you or family members have had with anesthetic medicines.  Any blood disorders you have.  Any surgeries you have had.  Any medical conditions you have.  Whether you are pregnant or may be pregnant. What are the risks? Generally, this is a safe procedure. However, problems may occur, including:  Serious chest pain and heart attack. This is only a risk if the stress portion of the test is done.  Rapid heartbeat.  Sensation of warmth in your chest. This usually passes quickly.  Allergic reaction to the tracer. What happens before the procedure?  Ask your health care provider about changing or stopping your regular medicines. This is especially important if you are taking diabetes medicines or blood thinners.  Follow instructions from your health care provider about eating or drinking restrictions.  Remove your jewelry on the day of the procedure. What happens during the procedure?  An IV will be inserted into one of your veins.  Your health care provider will inject a small amount of radioactive tracer through the IV.  You will wait for 20-40 minutes while the tracer travels through your bloodstream.  Your heart activity will be monitored with an electrocardiogram (ECG).  You will lie down on an exam table.  Images of your heart will be taken for  about 15-20 minutes.  You may also have a stress test. For this test, one of the following may be done: ? You will exercise on a treadmill or stationary bike. While you exercise, your heart's activity will be monitored with an ECG, and your blood pressure will be checked. ? You will be given medicines that will increase blood flow to parts of your heart. This is  done if you are unable to exercise.  When blood flow to your heart has peaked, a tracer will again be injected through the IV.  After 20-40 minutes, you will get back on the exam table and have more images taken of your heart.  Depending on the type of tracer used, scans may need to be repeated 3-4 hours later.  Your IV line will be removed when the procedure is over. The procedure may vary among health care providers and hospitals. What happens after the procedure?  Unless your health care provider tells you otherwise, you may return to your normal schedule, including diet, activities, and medicines.  Unless your health care provider tells you otherwise, you may increase your fluid intake. This will help to flush the contrast dye from your body. Drink enough fluid to keep your urine pale yellow.  Ask your health care provider, or the department that is doing the test: ? When will my results be ready? ? How will I get my results? Summary  A cardiac nuclear scan measures the blood flow to the heart when a person is resting and when he or she is exercising.  Tell your health care provider if you are pregnant.  Before the procedure, ask your health care provider about changing or stopping your regular medicines. This is especially important if you are taking diabetes medicines or blood thinners.  After the procedure, unless your health care provider tells you otherwise, increase your fluid intake. This will help flush the contrast dye from your body.  After the procedure, unless your health care provider tells you otherwise, you may return to your normal schedule, including diet, activities, and medicines. This information is not intended to replace advice given to you by your health care provider. Make sure you discuss any questions you have with your health care provider. Document Released: 10/27/2004 Document Revised: 03/18/2018 Document Reviewed: 03/18/2018 Elsevier Patient Education   2020 [employer]  Echocardiogram An echocardiogram is a procedure that uses painless sound waves (ultrasound) to produce an image of the heart. Images from an echocardiogram can provide important information about:  Signs of coronary artery disease (CAD).  Aneurysm detection. An aneurysm is a weak or damaged part of an artery wall that bulges out from the normal force of blood pumping through the body.  Heart size and shape. Changes in the size or shape of the heart can be associated with certain conditions, including heart failure, aneurysm, and CAD.  Heart muscle function.  Heart valve function.  Signs of a past heart attack.  Fluid buildup around the heart.  Thickening of the heart muscle.  A tumor or infectious growth around the heart valves. Tell a health care provider about:  Any allergies you have.  All medicines you are taking, including vitamins, herbs, eye drops, creams, and over-the-counter medicines.  Any blood disorders you have.  Any surgeries you have had.  Any medical conditions you have.  Whether you are pregnant or may be pregnant. What are the risks? Generally, this is a safe procedure. However, problems may occur, including:  Allergic reaction  to dye (contrast) that may be used during the procedure. What happens before the procedure? No specific preparation is needed. You may eat and drink normally. What happens during the procedure?   An IV tube may be inserted into one of your veins.  You may receive contrast through this tube. A contrast is an injection that improves the quality of the pictures from your heart.  A gel will be applied to your chest.  A wand-like tool (transducer) will be moved over your chest. The gel will help to transmit the sound waves from the transducer.  The sound waves will harmlessly bounce off of your heart to allow the heart images to be captured in real-time motion. The images will be recorded on a  computer. The procedure may vary among health care providers and hospitals. What happens after the procedure?  You may return to your normal, everyday life, including diet, activities, and medicines, unless your health care provider tells you not to do that. Summary  An echocardiogram is a procedure that uses painless sound waves (ultrasound) to produce an image of the heart.  Images from an echocardiogram can provide important information about the size and shape of your heart, heart muscle function, heart valve function, and fluid buildup around your heart.  You do not need to do anything to prepare before this procedure. You may eat and drink normally.  After the echocardiogram is completed, you may return to your normal, everyday life, unless your health care provider tells you not to do that. This information is not intended to replace advice given to you by your health care provider. Make sure you discuss any questions you have with your health care provider. Document Released: 09/29/2000 Document Revised: 01/23/2019 Document Reviewed: 11/04/2016 Elsevier Patient Education  [place]: At [facility], you and your health needs are our priority.  As part of our continuing mission to provide you with exceptional heart care, we have created designated Provider Care Teams.  These Care Teams include your primary Cardiologist (physician) and Advanced Practice Providers (APPs -  Physician Assistants and Nurse Practitioners) who all work together to provide you with the care you need, when you need it.  You will need a follow up appointment in 3 weeks.    Any Other Special Instructions Will Be Listed Below (If Applicable). NONE

## 2019-05-01 NOTE — Addendum Note (Signed)
Addended by: Polly Cobia A on: 05/01/2019 11:28 AM   Modules accepted: Orders

## 2019-05-05 NOTE — Addendum Note (Signed)
Addended by: Tarri Glenn on: 05/05/2019 04:55 PM   Modules accepted: Orders

## 2019-05-12 ENCOUNTER — Telehealth (HOSPITAL_COMMUNITY): Payer: Self-pay | Admitting: *Deleted

## 2019-05-12 NOTE — Telephone Encounter (Signed)
Attempted to call patient regarding upcoming appointment- no answer, unable to leave a message.  Anne Casey

## 2019-05-12 NOTE — Telephone Encounter (Signed)
Follow up ° ° °Patient is returning your call. Please call. ° ° ° °

## 2019-05-13 ENCOUNTER — Other Ambulatory Visit (HOSPITAL_COMMUNITY): Payer: BC Managed Care – PPO

## 2019-05-14 ENCOUNTER — Other Ambulatory Visit: Payer: Self-pay

## 2019-05-14 ENCOUNTER — Ambulatory Visit (HOSPITAL_BASED_OUTPATIENT_CLINIC_OR_DEPARTMENT_OTHER): Payer: BC Managed Care – PPO

## 2019-05-14 ENCOUNTER — Ambulatory Visit (HOSPITAL_COMMUNITY): Payer: BC Managed Care – PPO | Attending: Cardiology

## 2019-05-14 DIAGNOSIS — I471 Supraventricular tachycardia: Secondary | ICD-10-CM | POA: Insufficient documentation

## 2019-05-14 DIAGNOSIS — R002 Palpitations: Secondary | ICD-10-CM | POA: Diagnosis present

## 2019-05-14 LAB — MYOCARDIAL PERFUSION IMAGING
LV dias vol: 89 mL (ref 46–106)
LV sys vol: 30 mL
Peak HR: 122 {beats}/min
Rest HR: 71 {beats}/min
SDS: 2
SRS: 0
SSS: 2
TID: 1.16

## 2019-05-14 LAB — ECHOCARDIOGRAM COMPLETE
Height: 66 in
Weight: 4000 oz

## 2019-05-14 MED ORDER — TECHNETIUM TC 99M TETROFOSMIN IV KIT
32.2000 | PACK | Freq: Once | INTRAVENOUS | Status: AC | PRN
  Administered 2019-05-14: 32.2 via INTRAVENOUS
  Filled 2019-05-14: qty 33

## 2019-05-14 MED ORDER — TECHNETIUM TC 99M TETROFOSMIN IV KIT
10.1000 | PACK | Freq: Once | INTRAVENOUS | Status: AC | PRN
  Administered 2019-05-14: 10.1 via INTRAVENOUS
  Filled 2019-05-14: qty 11

## 2019-05-14 MED ORDER — REGADENOSON 0.4 MG/5ML IV SOLN
0.4000 mg | Freq: Once | INTRAVENOUS | Status: AC
  Administered 2019-05-14: 0.4 mg via INTRAVENOUS

## 2019-05-19 ENCOUNTER — Telehealth: Payer: Self-pay

## 2019-05-19 NOTE — Telephone Encounter (Signed)
Echo/lexi results relayed, copy of result sent to [facility] per Dr. Docia Furl request.

## 2019-05-19 NOTE — Telephone Encounter (Signed)
-----   Message from Rajan R Revankar, MD sent at 05/17/2019 11:56 PM EDT ----- The results of the study is unremarkable. Please inform patient. I will discuss in detail at next appointment. Cc  primary care/referring physician Rajan R Revankar, MD 05/17/2019 11:56 PM 

## 2019-05-21 ENCOUNTER — Other Ambulatory Visit: Payer: Self-pay

## 2019-05-21 ENCOUNTER — Encounter: Payer: Self-pay | Admitting: Cardiology

## 2019-05-21 ENCOUNTER — Ambulatory Visit (INDEPENDENT_AMBULATORY_CARE_PROVIDER_SITE_OTHER): Payer: BC Managed Care – PPO | Admitting: Cardiology

## 2019-05-21 VITALS — BP 138/84 | HR 71 | Ht 66.0 in | Wt 248.0 lb

## 2019-05-21 DIAGNOSIS — I471 Supraventricular tachycardia, unspecified: Secondary | ICD-10-CM

## 2019-05-21 DIAGNOSIS — R002 Palpitations: Secondary | ICD-10-CM

## 2019-05-21 MED ORDER — FLECAINIDE ACETATE 50 MG PO TABS: 50.0000 mg | ORAL_TABLET | Freq: Two times a day (BID) | ORAL | 4 refills | Status: DC

## 2019-05-21 NOTE — Patient Instructions (Signed)
Medication Instructions:  Your physician has recommended you make the following change in your medication:   START taking flecainide 50 mg (1 tablet) two times per day.   If you need a refill on your cardiac medications before your next appointment, please call your pharmacy.   Lab work: NONE If you have labs (blood work) drawn today and your tests are completely normal, you will receive your results only by: Marland Kitchen MyChart Message (if you have MyChart) OR . A paper copy in the mail If you have any lab test that is abnormal or we need to change your treatment, we will call you to review the results.  Testing/Procedures: You had an EKG performed today  Follow-Up: At [facility], you and your health needs are our priority.  As part of our continuing mission to provide you with exceptional heart care, we have created designated Provider Care Teams.  These Care Teams include your primary Cardiologist (physician) and Advanced Practice Providers (APPs -  Physician Assistants and Nurse Practitioners) who all work together to provide you with the care you need, when you need it. You will need a follow up appointment in 1 weeks.   Any Other Special Instructions Will Be Listed Below  Flecainide tablets What is this medicine? FLECAINIDE (FLEK a nide) is an antiarrhythmic drug. This medicine is used to prevent irregular heart rhythm. It can also slow down fast heartbeats called tachycardia. This medicine may be used for other purposes; ask your health care provider or pharmacist if you have questions. COMMON BRAND NAME(S): Tambocor What should I tell my health care provider before I take this medicine? They need to know if you have any of these conditions:  abnormal levels of potassium in the blood  heart disease including heart rhythm and heart rate problems  kidney or liver disease  recent heart attack  an unusual or allergic reaction to flecainide, local anesthetics, other medicines,  foods, dyes, or preservatives  pregnant or trying to get pregnant  breast-feeding How should I use this medicine? Take this medicine by mouth with a glass of water. Follow the directions on the prescription label. You can take this medicine with or without food. Take your doses at regular intervals. Do not take your medicine more often than directed. Do not stop taking this medicine suddenly. This may cause serious, heart-related side effects. If your doctor wants you to stop the medicine, the dose may be slowly lowered over time to avoid any side effects. Talk to your pediatrician regarding the use of this medicine in children. While this drug may be prescribed for children as young as 1 year of age for selected conditions, precautions do apply. Overdosage: If you think you have taken too much of this medicine contact a poison control center or emergency room at once. NOTE: This medicine is only for you. Do not share this medicine with others. What if I miss a dose? If you miss a dose, take it as soon as you can. If it is almost time for your next dose, take only that dose. Do not take double or extra doses. What may interact with this medicine? Do not take this medicine with any of the following medications:  amoxapine  arsenic trioxide  certain antibiotics like clarithromycin, erythromycin, gatifloxacin, gemifloxacin, levofloxacin, moxifloxacin, sparfloxacin, or troleandomycin  certain antidepressants called tricyclic antidepressants like amitriptyline, imipramine, or nortriptyline  certain medicines to control heart rhythm like disopyramide, encainide, moricizine, procainamide, propafenone, and quinidine  cisapride  delavirdine  droperidol  haloperidol  hawthorn  imatinib  levomethadyl  maprotiline  medicines for malaria like chloroquine and halofantrine  pentamidine  phenothiazines like chlorpromazine, mesoridazine, prochlorperazine,  thioridazine  pimozide  quinine  ranolazine  ritonavir  sertindole This medicine may also interact with the following medications:  cimetidine  dofetilide  medicines for angina or high blood pressure  medicines to control heart rhythm like amiodarone and digoxin  ziprasidone This list may not describe all possible interactions. Give your health care provider a list of all the medicines, herbs, non-prescription drugs, or dietary supplements you use. Also tell them if you smoke, drink alcohol, or use illegal drugs. Some items may interact with your medicine. What should I watch for while using this medicine? Visit your doctor or health care professional for regular checks on your progress. Because your condition and the use of this medicine carries some risk, it is a good idea to carry an identification card, necklace or bracelet with details of your condition, medications and doctor or health care professional. Check your blood pressure and pulse rate regularly. Ask your health care professional what your blood pressure and pulse rate should be, and when you should contact him or her. Your doctor or health care professional also may schedule regular blood tests and electrocardiograms to check your progress. You may get drowsy or dizzy. Do not drive, use machinery, or do anything that needs mental alertness until you know how this medicine affects you. Do not stand or sit up quickly, especially if you are an older patient. This reduces the risk of dizzy or fainting spells. Alcohol can make you more dizzy, increase flushing and rapid heartbeats. Avoid alcoholic drinks. What side effects may I notice from receiving this medicine? Side effects that you should report to your doctor or health care professional as soon as possible:  chest pain, continued irregular heartbeats  difficulty breathing  swelling of the legs or feet  trembling, shaking  unusually weak or tired Side effects  that usually do not require medical attention (report to your doctor or health care professional if they continue or are bothersome):  blurred vision  constipation  headache  nausea, vomiting  stomach pain This list may not describe all possible side effects. Call your doctor for medical advice about side effects. You may report side effects to FDA at 1-800-FDA-1088. Where should I keep my medicine? Keep out of the reach of children. Store at room temperature between 15 and 30 degrees C (59 and 86 degrees F). Protect from light. Keep container tightly closed. Throw away any unused medicine after the expiration date. NOTE: This sheet is a summary. It may not cover all possible information. If you have questions about this medicine, talk to your doctor, pharmacist, or health care provider.  2020 Elsevier/Gold Standard (2018-09-23 11:41:38)

## 2019-05-21 NOTE — Progress Notes (Signed)
Cardiology Office Note:    Date:  05/21/2019   ID:  Anne Casey, DOB 1971/08/12, MRN [id number]  PCP:  Darreld Mclean, MD  Cardiologist:  Jenean Lindau, MD   Referring MD: Darreld Mclean, MD    ASSESSMENT:    1. Palpitation   2. SVT (supraventricular tachycardia) (HCC)    PLAN:    In order of problems listed above:  1. Supraventricular tachycardia: Narrow complex tachycardia: I discussed my findings with the patient at extensive length.  Recent blood work was reviewed with the patient.  In view of this we will start her on flecainide 50 mg twice daily.  Baseline EKG done today reveals sinus rhythm and nonspecific ST-T changes. 2. Mixed dyslipidemia: Diet was discussed for dyslipidemia and risks of obesity explained and she vocalized understanding.  Importance of regular stressed.  Weight reduction stressed.Patient will be seen in follow-up appointment in 6 weeks or earlier if the patient has any concerns    Medication Adjustments/Labs and Tests Ordered: Current medicines are reviewed at length with the patient today.  Concerns regarding medicines are outlined above.  No orders of the defined types were placed in this encounter.  No orders of the defined types were placed in this encounter.    No chief complaint on file.    History of Present Illness:    Anne Casey is a 48 y.o. female.  Patient has history of supraventricular tachycardia.  She denies any problems at this time except occasional palpitations and she really wants to be on antiarrhythmic therapy because these palpitations do affect her quality of life.  For this reason she was evaluated with stress test and echocardiogram and both were unremarkable.  Please see reports mentioned in detail below.  At the time of my evaluation, the patient is alert awake oriented and in no distress.  Past Medical History:  Diagnosis Date  . History of Holter monitoring 09/2017   sinus tachycardia  . Palpitation    . SVT (supraventricular tachycardia) ([place])     History reviewed. No pertinent surgical history.  Current Medications: Current Meds  Medication Sig  . metoprolol tartrate (LOPRESSOR) 50 MG tablet Take 1 tablet by mouth 2 (two) times a day.     Allergies:   Patient has no known allergies.   Social History   Socioeconomic History  . Marital status: Single    Spouse name: Not on file  . Number of children: Not on file  . Years of education: Not on file  . Highest education level: Not on file  Occupational History  . Not on file  Social Needs  . Financial resource strain: Not on file  . Food insecurity    Worry: Not on file    Inability: Not on file  . Transportation needs    Medical: Not on file    Non-medical: Not on file  Tobacco Use  . Smoking status: Never Smoker  . Smokeless tobacco: Never Used  Substance and Sexual Activity  . Alcohol use: No  . Drug use: No  . Sexual activity: Not on file  Lifestyle  . Physical activity    Days per week: Not on file    Minutes per session: Not on file  . Stress: Not on file  Relationships  . Social Herbalist on phone: Not on file    Gets together: Not on file    Attends religious service: Not on file    Active member  of club or organization: Not on file    Attends meetings of clubs or organizations: Not on file    Relationship status: Not on file  Other Topics Concern  . Not on file  Social History Narrative  . Not on file     Family History: The patient's family history is not on file.  ROS:   Please see the history of present illness.    All other systems reviewed and are negative.  EKGs/Labs/Other Studies Reviewed:    The following studies were reviewed today: IMPRESSIONS    1. The left ventricle has normal systolic function with an ejection fraction of 60-65%. The cavity size was normal. There is mildly increased left ventricular wall thickness. Left ventricular diastolic function could not be  evaluated due to  indeterminate diastolic function. No evidence of left ventricular regional wall motion abnormalities.  2. The right ventricle has normal systolic function. The cavity was normal. There is no increase in right ventricular wall thickness.  3. Left atrial size was mildly dilated.  4. The mitral valve is grossly normal.  5. The tricuspid valve is grossly normal.  6. The aortic valve is tricuspid. No stenosis of the aortic valve.  7. The aorta is normal in size and structure.  8. The inferior vena cava was normal in size with <50% respiratory variability.  Study Highlights    Nuclear stress EF: 66%.  There was no ST segment deviation noted during stress.  The study is normal.  This is a low risk study.  The left ventricular ejection fraction is hyperdynamic (>65%).   Normal resting and stress perfusion. No ischemia or infarction EF 66%      Recent Labs: 04/30/2019: ALT 15; BUN 13; Creatinine, Ser 0.63; Hemoglobin 13.3; Platelets 229.0; Potassium 3.9; Sodium 139; TSH 1.14  Recent Lipid Panel    Component Value Date/Time   CHOL 176 04/30/2019 1341   CHOL 188 12/06/2016 1155   TRIG 181.0 (H) 04/30/2019 1341   HDL 43.10 04/30/2019 1341   HDL 41 12/06/2016 1155   CHOLHDL 4 04/30/2019 1341   VLDL 36.2 04/30/2019 1341   LDLCALC 96 04/30/2019 1341   LDLCALC 108 (H) 12/06/2016 1155    Physical Exam:    VS:  BP 138/84   Pulse 71   Ht 5\' 6"  (1.676 m)   Wt 248 lb 0.6 oz (112.5 kg)   SpO2 97%   BMI 40.03 kg/m     Wt Readings from Last 3 Encounters:  05/21/19 248 lb 0.6 oz (112.5 kg)  05/14/19 250 lb (113.4 kg)  05/01/19 250 lb (113.4 kg)     GEN: Patient is in no acute distress HEENT: Normal NECK: No JVD; No carotid bruits LYMPHATICS: No lymphadenopathy CARDIAC: Hear sounds regular, 2/6 systolic murmur at the apex. RESPIRATORY:  Clear to auscultation without rales, wheezing or rhonchi  ABDOMEN: Soft, non-tender, non-distended MUSCULOSKELETAL:  No  edema; No deformity  SKIN: Warm and dry NEUROLOGIC:  Alert and oriented x 3 PSYCHIATRIC:  Normal affect   Signed, Garwin Brothersajan R Jatoya Armbrister, MD  05/21/2019 2:39 PM    [facility]

## 2019-05-22 ENCOUNTER — Telehealth: Payer: Self-pay

## 2019-05-22 NOTE — Telephone Encounter (Signed)
Copied from [place] [id number]. Topic: General - Other >> May 22, 2019 11:37 AM Antonieta Iba C wrote: Reason for CRM: [facility] is calling in to make provider aware that they have been reaching out to pt to schedule but has been unable to reach. They are unable to lvm for pt because her vm is full

## 2019-05-26 ENCOUNTER — Ambulatory Visit: Payer: BC Managed Care – PPO | Admitting: Cardiology

## 2019-05-27 NOTE — Telephone Encounter (Signed)
Tried calling patient no answer, unable to reach her. Will try again.

## 2019-05-28 NOTE — Progress Notes (Signed)
Cardiology Office Note:    Date:  05/29/2019   ID:  Anne Casey, DOB 09-Oct-1971, MRN [id number]  PCP:  Pearline Cablesopland, Jessica C, MD  Cardiologist:  Norman HerrlichBrian Amory Zbikowski, MD    Referring MD: Pearline Cablesopland, Jessica C, MD    ASSESSMENT:    1. SVT (supraventricular tachycardia) (HCC)   2. High risk medication use    PLAN:    In order of problems listed above:  1. SVT - Reports no recurrence since initiating Flecainide. Continue flecainide and metoprolol. Recommended Kardia device. Given education to avoid OTC proarrthymics.  2. High risk medication use - Flecainide secondary to SVT. EKG today with SR with normal QRS. Continue flecainide and metoprolol.  Next appointment: 6 months with Revankar   Medication Adjustments/Labs and Tests Ordered: Current medicines are reviewed at length with the patient today.  Concerns regarding medicines are outlined above.  Orders Placed This Encounter  Procedures  . EKG 12-Lead   No orders of the defined types were placed in this encounter.   Chief Complaint: 48 yo female with PMH SVT presents for follow up after initiation of flecainide therapy.   History of Present Illness:    Anne Casey is a 48 y.o. female with a hx of SVT recently initiated on flecanide last seen 05/21/2019. She presents today for EKG for monitoring of flecainide therapy.   Compliance with diet, lifestyle and medications: Yes Past Medical History:  Diagnosis Date  . History of Holter monitoring 09/2017   sinus tachycardia  . Palpitation   . SVT (supraventricular tachycardia) (HCC)     Past Surgical History:  Procedure Laterality Date  . NO PAST SURGERIES      Current Medications: Current Meds  Medication Sig  . flecainide (TAMBOCOR) 50 MG tablet Take 1 tablet (50 mg total) by mouth 2 (two) times daily.  . metoprolol tartrate (LOPRESSOR) 25 MG tablet Take 1 tablet by mouth 2 (two) times a day.      Allergies:   Patient has no known allergies.   Social History    Socioeconomic History  . Marital status: Single    Spouse name: Not on file  . Number of children: Not on file  . Years of education: Not on file  . Highest education level: Not on file  Occupational History  . Not on file  Social Needs  . Financial resource strain: Not on file  . Food insecurity    Worry: Not on file    Inability: Not on file  . Transportation needs    Medical: Not on file    Non-medical: Not on file  Tobacco Use  . Smoking status: Never Smoker  . Smokeless tobacco: Never Used  Substance and Sexual Activity  . Alcohol use: No  . Drug use: No  . Sexual activity: Not on file  Lifestyle  . Physical activity    Days per week: Not on file    Minutes per session: Not on file  . Stress: Not on file  Relationships  . Social Musicianconnections    Talks on phone: Not on file    Gets together: Not on file    Attends religious service: Not on file    Active member of club or organization: Not on file    Attends meetings of clubs or organizations: Not on file    Relationship status: Not on file  Other Topics Concern  . Not on file  Social History Narrative  . Not on file  Family History: The patient's family history includes Colon cancer in her mother; Congestive Heart Failure in her father; Heart Problems in her sister; Hypertension in her mother. ROS:   Review of Systems  Constitution: Negative for chills, fever and malaise/fatigue.  Cardiovascular: Negative for chest pain, dyspnea on exertion, irregular heartbeat, near-syncope, palpitations and syncope.  Gastrointestinal: Negative for nausea and vomiting.  Neurological: Positive for headaches ("sometimes"). Negative for dizziness and light-headedness.    Please see the history of present illness.    All other systems reviewed and are negative.  EKGs/Labs/Other Studies Reviewed:    The following studies were reviewed today:  EKG:  EKG ordered today and personally reviewed.  The ekg ordered today  demonstrates SR with normal QRS.   Recent Labs: 04/30/2019: ALT 15; BUN 13; Creatinine, Ser 0.63; Hemoglobin 13.3; Platelets 229.0; Potassium 3.9; Sodium 139; TSH 1.14  Recent Lipid Panel    Component Value Date/Time   CHOL 176 04/30/2019 1341   CHOL 188 12/06/2016 1155   TRIG 181.0 (H) 04/30/2019 1341   HDL 43.10 04/30/2019 1341   HDL 41 12/06/2016 1155   CHOLHDL 4 04/30/2019 1341   VLDL 36.2 04/30/2019 1341   LDLCALC 96 04/30/2019 1341   LDLCALC 108 (H) 12/06/2016 1155    Physical Exam:    VS:  BP 122/82 (BP Location: Right Arm, Patient Position: Sitting, Cuff Size: Large)   Pulse 66   Ht 5\' 6"  (1.676 m)   Wt 253 lb (114.8 kg)   SpO2 97%   BMI 40.84 kg/m     Wt Readings from Last 3 Encounters:  05/29/19 253 lb (114.8 kg)  05/21/19 248 lb 0.6 oz (112.5 kg)  05/14/19 250 lb (113.4 kg)     GEN:  Well nourished, well developed in no acute distress HEENT: Normal NECK: No JVD; No carotid bruits LYMPHATICS: No lymphadenopathy CARDIAC: RRR, no murmurs, rubs, gallops RESPIRATORY:  Clear to auscultation without rales, wheezing or rhonchi  ABDOMEN: Soft, non-tender, non-distended MUSCULOSKELETAL:  No edema; No deformity  SKIN: Warm and dry NEUROLOGIC:  Alert and oriented x 3 PSYCHIATRIC:  Normal affect    Signed, Shirlee More, MD  05/29/2019 10:40 AM    [place]

## 2019-05-29 ENCOUNTER — Ambulatory Visit: Payer: BC Managed Care – PPO | Admitting: Cardiovascular Disease

## 2019-05-29 ENCOUNTER — Other Ambulatory Visit: Payer: Self-pay

## 2019-05-29 ENCOUNTER — Encounter: Payer: Self-pay | Admitting: Cardiology

## 2019-05-29 ENCOUNTER — Ambulatory Visit (INDEPENDENT_AMBULATORY_CARE_PROVIDER_SITE_OTHER): Payer: BC Managed Care – PPO | Admitting: Cardiology

## 2019-05-29 VITALS — BP 122/82 | HR 66 | Ht 66.0 in | Wt 253.0 lb

## 2019-05-29 DIAGNOSIS — Z79899 Other long term (current) drug therapy: Secondary | ICD-10-CM

## 2019-05-29 DIAGNOSIS — I471 Supraventricular tachycardia: Secondary | ICD-10-CM

## 2019-05-29 HISTORY — DX: Other long term (current) drug therapy: Z79.899

## 2019-05-29 NOTE — Patient Instructions (Addendum)
Medication Instructions:  No changes today.  Some medications can affect your heart rate and rhythm. Below are some recommendations for over the counter medications.    If you need a refill on your cardiac medications before your next appointment, please call your pharmacy.   Lab work: No lab work today.  If you have labs (blood work) drawn today and your tests are completely normal, you will receive your results only by: Marland Kitchen MyChart Message (if you have MyChart) OR . A paper copy in the mail If you have any lab test that is abnormal or we need to change your treatment, we will call you to review the results.  Testing/Procedures: You had an EKG today.    Follow-Up: At [facility], you and your health needs are our priority.  As part of our continuing mission to provide you with exceptional heart care, we have created designated Provider Care Teams.  These Care Teams include your primary Cardiologist (physician) and Advanced Practice Providers (APPs -  Physician Assistants and Nurse Practitioners) who all work together to provide you with the care you need, when you need it. You will need a follow up appointment in 6 months.    Any Other Special Instructions Will Be Listed Below (If Applicable).  1. Avoid all over-the-counter antihistamines except Claritin/Loratadine and Zyrtec/Cetrizine. 2. Avoid all combination including cold sinus allergies flu decongestant and sleep medications 3. You can use Robitussin DM Mucinex and Mucinex DM for cough. 4. can use Tylenol aspirin ibuprofen and naproxen but no combinations such as sleep or sinus.  You may purchase the Kardia device, if you want to be able to monitor your heart rhythm at home. We have included details below.    KardiaMobile Https://store.alivecor.com/products/kardiamobile        FDA-cleared, clinical grade mobile EKG monitor: Jodelle Red is the most clinically-validated mobile EKG used by the world's leading cardiac care  medical professionals With Basic service, know instantly if your heart rhythm is normal or if atrial fibrillation is detected, and email the last single EKG recording to yourself or your doctor Premium service, available for purchase through the Kardia app for $9.99 per month or $99 per year, includes unlimited history and storage of your EKG recordings, a monthly EKG summary report to share with your doctor, along with the ability to track your blood pressure, activity and weight Includes one KardiaMobile phone clip FREE SHIPPING: Standard delivery 1-3 business days. Orders placed by 11:00am PST will ship that afternoon. Otherwise, will ship next business day. All orders ship via [employer] from [place], [place]

## 2019-07-10 ENCOUNTER — Ambulatory Visit: Payer: BC Managed Care – PPO | Admitting: Cardiology

## 2019-08-04 ENCOUNTER — Other Ambulatory Visit: Payer: Self-pay | Admitting: Cardiology

## 2019-08-30 ENCOUNTER — Telehealth: Payer: Self-pay | Admitting: Physician Assistant

## 2019-08-30 NOTE — Telephone Encounter (Signed)
Patient was recently prescribed amoxicillin for UTI.  She is on flecainide and metoprolol.  She called to make sure that amoxicillin was safe to take with these drugs.  I explained to her that there are no known interactions.  She had no further questions. Richardson Dopp, PA-C 08/30/2019 2:20 PM

## 2019-10-19 ENCOUNTER — Other Ambulatory Visit: Payer: Self-pay | Admitting: Cardiology

## 2019-11-14 ENCOUNTER — Other Ambulatory Visit: Payer: Self-pay | Admitting: Cardiology

## 2019-11-14 MED ORDER — FLECAINIDE ACETATE 50 MG PO TABS: ORAL_TABLET | ORAL | 1 refills | Status: DC

## 2019-11-14 NOTE — Telephone Encounter (Signed)
Medication refilled

## 2019-11-14 NOTE — Telephone Encounter (Signed)
*  STAT* If patient is at the pharmacy, call can be transferred to refill team.   1. Which medications need to be refilled? (please list name of each medication and dose if known) flecainide (TAMBOCOR) 50 MG tablet  2. Which pharmacy/location (including street and city if local pharmacy) is medication to be sent to? [id number] - , [place] - [address]  3. Do they need a 30 day or 90 day supply? 90 day supply

## 2019-11-24 ENCOUNTER — Telehealth: Payer: Self-pay | Admitting: Family Medicine

## 2019-11-24 MED ORDER — METOPROLOL TARTRATE 25 MG PO TABS: 25.0000 mg | ORAL_TABLET | Freq: Two times a day (BID) | ORAL | 1 refills | Status: DC

## 2019-11-24 NOTE — Telephone Encounter (Signed)
Medication: metoprolol tartrate (LOPRESSOR) 25 MG tablet   Has the patient contacted their pharmacy? Yes.   (If no, request that the patient contact the pharmacy for the refill.) (If yes, when and what did the pharmacy advise?) They sent in refill request and no repy over a week ago  Preferred Pharmacy (with phone number or street name):  [id number] [place], [place] - [address] AT [facility] [address]  [address], [place] [address]  Phone:  [phone] Fax:  [phone]  Agent: Please be advised that RX refills may take up to 3 business days. We ask that you follow-up with your pharmacy.

## 2019-11-24 NOTE — Telephone Encounter (Signed)
Medication refilled

## 2019-12-29 ENCOUNTER — Ambulatory Visit (INDEPENDENT_AMBULATORY_CARE_PROVIDER_SITE_OTHER): Payer: BC Managed Care – PPO | Admitting: Cardiology

## 2019-12-29 ENCOUNTER — Encounter: Payer: Self-pay | Admitting: Cardiology

## 2019-12-29 ENCOUNTER — Other Ambulatory Visit: Payer: Self-pay

## 2019-12-29 VITALS — BP 118/88 | HR 71 | Ht 66.0 in | Wt 261.0 lb

## 2019-12-29 DIAGNOSIS — I471 Supraventricular tachycardia: Secondary | ICD-10-CM | POA: Diagnosis not present

## 2019-12-29 DIAGNOSIS — R002 Palpitations: Secondary | ICD-10-CM

## 2019-12-29 DIAGNOSIS — Z1329 Encounter for screening for other suspected endocrine disorder: Secondary | ICD-10-CM

## 2019-12-29 HISTORY — DX: Morbid (severe) obesity due to excess calories: E66.01

## 2019-12-29 MED ORDER — FLECAINIDE ACETATE 50 MG PO TABS: ORAL_TABLET | ORAL | 1 refills | Status: DC

## 2019-12-29 NOTE — Progress Notes (Signed)
Cardiology Office Note:    Date:  12/29/2019   ID:  Anne Casey, DOB 1971/04/09, MRN [id number]  PCP:  Pearline Cables, MD  Cardiologist:  Garwin Brothers, MD   Referring MD: Pearline Cables, MD    ASSESSMENT:    1. SVT (supraventricular tachycardia) (HCC)   2. Palpitation    PLAN:    In order of problems listed above:  1. Supraventricular tachycardia: Palpitations have resolved and the paroxysms are virtually nonexistent.  Patient is tolerating beta-blocker and flecainide well and is very happy about it.  We will have a Chem-7 and a TSH level done today. 2. Morbid obesity: Diet was discussed and weight reduction was stressed.  I discussed this with her extensively and I told her to walk at least 30 minutes a day 5 days a week and she promises to do so. 3. Patient will be seen in follow-up appointment in 6 months or earlier if the patient has any concerns    Medication Adjustments/Labs and Tests Ordered: Current medicines are reviewed at length with the patient today.  Concerns regarding medicines are outlined above.  No orders of the defined types were placed in this encounter.  No orders of the defined types were placed in this encounter.    Chief Complaint  Patient presents with  . Follow-up    6 Months     History of Present Illness:    Anne Casey is a 49 y.o. female.  Patient has past medical history of supraventricular tachycardia and palpitations.  She denies any problems at this time and takes care of activities of daily living.  No chest pain orthopnea or PND.  She leads a sedentary lifestyle.  At the time of my evaluation, the patient is alert awake oriented and in no distress.  Past Medical History:  Diagnosis Date  . History of Holter monitoring 09/2017   sinus tachycardia  . Palpitation   . SVT (supraventricular tachycardia) (HCC)     Past Surgical History:  Procedure Laterality Date  . NO PAST SURGERIES      Current  Medications: Current Meds  Medication Sig  . flecainide (TAMBOCOR) 50 MG tablet TAKE 1 TABLET(50 MG) BY MOUTH TWICE DAILY  . metoprolol tartrate (LOPRESSOR) 25 MG tablet Take 1 tablet (25 mg total) by mouth 2 (two) times daily.     Allergies:   Patient has no known allergies.   Social History   Socioeconomic History  . Marital status: Single    Spouse name: Not on file  . Number of children: Not on file  . Years of education: Not on file  . Highest education level: Not on file  Occupational History  . Not on file  Tobacco Use  . Smoking status: Never Smoker  . Smokeless tobacco: Never Used  Substance and Sexual Activity  . Alcohol use: No  . Drug use: No  . Sexual activity: Not on file  Other Topics Concern  . Not on file  Social History Narrative  . Not on file   Social Determinants of Health   Financial Resource Strain:   . Difficulty of Paying Living Expenses:   Food Insecurity:   . Worried About Programme researcher, broadcasting/film/video in the Last Year:   . Barista in the Last Year:   Transportation Needs:   . Freight forwarder (Medical):   Marland Kitchen Lack of Transportation (Non-Medical):   Physical Activity:   . Days of Exercise per Week:   .  Minutes of Exercise per Session:   Stress:   . Feeling of Stress :   Social Connections:   . Frequency of Communication with Friends and Family:   . Frequency of Social Gatherings with Friends and Family:   . Attends Religious Services:   . Active Member of Clubs or Organizations:   . Attends Archivist Meetings:   Marland Kitchen Marital Status:      Family History: The patient's family history includes Colon cancer in her mother; Congestive Heart Failure in her father; Heart Problems in her sister; Hypertension in her mother.  ROS:   Please see the history of present illness.    All other systems reviewed and are negative.  EKGs/Labs/Other Studies Reviewed:    The following studies were reviewed today: EKG reveals sinus rhythm  and nonspecific ST-T changes and QT interval is within normal limits.   Recent Labs: 04/30/2019: ALT 15; BUN 13; Creatinine, Ser 0.63; Hemoglobin 13.3; Platelets 229.0; Potassium 3.9; Sodium 139; TSH 1.14  Recent Lipid Panel    Component Value Date/Time   CHOL 176 04/30/2019 1341   CHOL 188 12/06/2016 1155   TRIG 181.0 (H) 04/30/2019 1341   HDL 43.10 04/30/2019 1341   HDL 41 12/06/2016 1155   CHOLHDL 4 04/30/2019 1341   VLDL 36.2 04/30/2019 1341   LDLCALC 96 04/30/2019 1341   LDLCALC 108 (H) 12/06/2016 1155    Physical Exam:    VS:  BP 118/88   Pulse 71   Ht 5\' 6"  (1.676 m)   Wt 261 lb (118.4 kg)   SpO2 97%   BMI 42.13 kg/m     Wt Readings from Last 3 Encounters:  12/29/19 261 lb (118.4 kg)  05/29/19 253 lb (114.8 kg)  05/21/19 248 lb 0.6 oz (112.5 kg)     GEN: Patient is in no acute distress HEENT: Normal NECK: No JVD; No carotid bruits LYMPHATICS: No lymphadenopathy CARDIAC: Hear sounds regular, 2/6 systolic murmur at the apex. RESPIRATORY:  Clear to auscultation without rales, wheezing or rhonchi  ABDOMEN: Soft, non-tender, non-distended MUSCULOSKELETAL:  No edema; No deformity  SKIN: Warm and dry NEUROLOGIC:  Alert and oriented x 3 PSYCHIATRIC:  Normal affect   Signed, Jenean Lindau, MD  12/29/2019 4:28 PM    [facility]

## 2019-12-29 NOTE — Patient Instructions (Signed)
Medication Instructions:  No medication changes *If you need a refill on your cardiac medications before your next appointment, please call your pharmacy*   Lab Work: You had a BMET and TSH today. If you have labs (blood work) drawn today and your tests are completely normal, you will receive your results only by: Marland Kitchen MyChart Message (if you have MyChart) OR . A paper copy in the mail If you have any lab test that is abnormal or we need to change your treatment, we will call you to review the results.   Testing/Procedures: None ordered   Follow-Up: At [facility], you and your health needs are our priority.  As part of our continuing mission to provide you with exceptional heart care, we have created designated Provider Care Teams.  These Care Teams include your primary Cardiologist (physician) and Advanced Practice Providers (APPs -  Physician Assistants and Nurse Practitioners) who all work together to provide you with the care you need, when you need it.  We recommend signing up for the patient portal called "MyChart".  Sign up information is provided on this After Visit Summary.  MyChart is used to connect with patients for Virtual Visits (Telemedicine).  Patients are able to view lab/test results, encounter notes, upcoming appointments, etc.  Non-urgent messages can be sent to your provider as well.   To learn more about what you can do with MyChart, go to [email or web address].    Your next appointment:   6 month(s)  The format for your next appointment:   In Person  Provider:   Belva Crome, MD   Other Instructions Have a great day and thank you for choosing [facility]!!

## 2019-12-30 ENCOUNTER — Encounter: Payer: Self-pay | Admitting: *Deleted

## 2019-12-30 LAB — BASIC METABOLIC PANEL
BUN/Creatinine Ratio: 19 (ref 9–23)
BUN: 13 mg/dL (ref 6–24)
CO2: 23 mmol/L (ref 20–29)
Calcium: 9.6 mg/dL (ref 8.7–10.2)
Chloride: 103 mmol/L (ref 96–106)
Creatinine, Ser: 0.69 mg/dL (ref 0.57–1.00)
GFR calc Af Amer: 118 mL/min/{1.73_m2} (ref >59)
GFR calc non Af Amer: 103 mL/min/{1.73_m2} (ref >59)
Glucose: 106 mg/dL — ABNORMAL HIGH (ref 65–99)
Potassium: 4.4 mmol/L (ref 3.5–5.2)
Sodium: 140 mmol/L (ref 134–144)

## 2019-12-30 LAB — TSH: TSH: 0.839 u[IU]/mL (ref 0.450–4.500)

## 2020-01-09 ENCOUNTER — Other Ambulatory Visit: Payer: Self-pay | Admitting: Cardiology

## 2020-03-29 ENCOUNTER — Emergency Department
Admission: EM | Admit: 2020-03-29 | Discharge: 2020-03-29 | Disposition: A | Discharge status: Home / Self Care | Payer: BC Managed Care – PPO | Attending: Emergency Medicine | Admitting: Emergency Medicine

## 2020-03-29 ENCOUNTER — Other Ambulatory Visit: Payer: Self-pay

## 2020-03-29 ENCOUNTER — Encounter: Payer: Self-pay | Admitting: Emergency Medicine

## 2020-03-29 DIAGNOSIS — T63441A Toxic effect of venom of bees, accidental (unintentional), initial encounter: Secondary | ICD-10-CM | POA: Diagnosis not present

## 2020-03-29 MED ORDER — EPINEPHRINE 0.3 MG/0.3ML IJ SOAJ: 0.3000 mg | INTRAMUSCULAR | 1 refills | Status: AC | PRN

## 2020-03-29 MED ORDER — HYDROXYZINE HCL 50 MG PO TABS: 50.0000 mg | ORAL_TABLET | Freq: Three times a day (TID) | ORAL | 0 refills | Status: DC | PRN

## 2020-03-29 MED ORDER — HYDROXYZINE HCL 50 MG PO TABS
50.0000 mg | ORAL_TABLET | Freq: Once | ORAL | Status: AC
  Administered 2020-03-29: 50 mg via ORAL
  Filled 2020-03-29: qty 1

## 2020-03-29 MED ORDER — PREDNISONE 20 MG PO TABS
60.0000 mg | ORAL_TABLET | Freq: Once | ORAL | Status: AC
  Administered 2020-03-29: 60 mg via ORAL
  Filled 2020-03-29: qty 3

## 2020-03-29 MED ORDER — DIPHENHYDRAMINE HCL 25 MG PO CAPS: 25.0000 mg | ORAL_CAPSULE | Freq: Once | ORAL | Status: AC

## 2020-03-29 MED ORDER — DIPHENHYDRAMINE HCL 25 MG PO CAPS
ORAL_CAPSULE | ORAL | Status: AC
  Administered 2020-03-29: 25 mg via ORAL
  Filled 2020-03-29: qty 1

## 2020-03-29 NOTE — Discharge Instructions (Addendum)
Follow discharge care instruction and consider current EpiPen in the future.

## 2020-03-29 NOTE — ED Notes (Signed)
See triage note  Presents s/p bee sting   Presents with redness to hands,face and ears  No resp distress noted

## 2020-03-29 NOTE — ED Provider Notes (Signed)
[facility] Emergency Department Provider Note   ____________________________________________   First MD Initiated Contact with Patient 03/29/20 1136     (approximate)  I have reviewed the triage vital signs and the nursing notes.   HISTORY  Chief Complaint Allergic Reaction    HPI Anne Casey is a 49 y.o. female patient presents with reaction to bee sting.  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  Patient stated when the symptoms occurred this morning she took Benadryl and reported moderate relief of symptoms prior to arrival.  Patient states decreased pain edema at the site of the bee stings.  Patient denies shortness of breath at this time.  Patient denies chest tightness.         Past Medical History:  Diagnosis Date  . History of Holter monitoring 09/2017   sinus tachycardia  . Palpitation   . SVT (supraventricular tachycardia) [facility])     Patient Active Problem List   Diagnosis Date Noted  . Morbid obesity (HCC) 12/29/2019  . High risk medication use 05/29/2019  . Palpitation   . SVT (supraventricular tachycardia) (HCC) 08/09/2012    Past Surgical History:  Procedure Laterality Date  . NO PAST SURGERIES      Prior to Admission medications   Medication Sig Start Date End Date Taking? Authorizing Provider  EPINEPHrine (EPIPEN 2-PAK) 0.3 mg/0.3 mL IJ SOAJ injection Inject 0.3 mLs (0.3 mg total) into the muscle as needed for anaphylaxis. 03/29/20   Joni Reining, PA-C  flecainide (TAMBOCOR) 50 MG tablet TAKE 1 TABLET(50 MG) BY MOUTH TWICE DAILY 12/29/19   Revankar, Aundra Dubin, MD  hydrOXYzine (ATARAX/VISTARIL) 50 MG tablet Take 1 tablet (50 mg total) by mouth 3 (three) times daily as needed. 03/29/20   Joni Reining, PA-C  metoprolol tartrate (LOPRESSOR) 25 MG tablet Take 1 tablet (25 mg total) by mouth 2 (two) times daily. 11/24/19   Copland,  Gwenlyn Found, MD    Allergies Patient has no known allergies.  Family History  Problem Relation Age of Onset  . Hypertension Mother   . Colon cancer Mother   . Congestive Heart Failure Father   . Heart Problems Sister     Social History Social History   Tobacco Use  . Smoking status: Never Smoker  . Smokeless tobacco: Never Used  Vaping Use  . Vaping Use: Never used  Substance Use Topics  . Alcohol use: No  . Drug use: No    Review of Systems Constitutional: No fever/chills Eyes: No visual changes. ENT: No sore throat. Cardiovascular: History of SVT.  Denies chest pain.  Resolving chest tightness. Respiratory: Resolving shortness of breath. Gastrointestinal: No abdominal pain.  No nausea, no vomiting.  No diarrhea.  No constipation. Genitourinary: Negative for dysuria. Musculoskeletal: Negative for back pain. Skin: Negative for rash.  Resolving edema and erythema to the dorsal aspect of the right wrist. Neurological: Negative for headaches, focal weakness or numbness. ____________________________________________   PHYSICAL EXAM:  VITAL SIGNS: ED Triage Vitals  Enc Vitals Group     BP 03/29/20 1046 (!) 153/83     Pulse Rate 03/29/20 1046 69     Resp 03/29/20 1046 20     Temp 03/29/20 1046 98.3 F (36.8 C)     Temp Source 03/29/20 1046 Oral     SpO2 03/29/20 1046 97 %     Weight 03/29/20 1048 258  lb (117 kg)     Height 03/29/20 1048 5\' 7"  (1.702 m)     Head Circumference --      Peak Flow --      Pain Score 03/29/20 1048 0     Pain Loc --      Pain Edu? --      Excl. in [place]? --    Constitutional: Alert and oriented. Well appearing and in no acute distress. Neck: No stridor. Cardiovascular: Normal rate, regular rhythm. Grossly normal heart sounds.  Good peripheral circulation. Respiratory: Normal respiratory effort.  No retractions. Lungs CTAB. Gastrointestinal: Soft and nontender. No distention. No abdominal bruits. No CVA tenderness. Musculoskeletal: No  lower extremity tenderness nor edema.  No joint effusions. Neurologic:  Normal speech and language. No gross focal neurologic deficits are appreciated. No gait instability. Skin:  Skin is warm, dry and intact.  Erythema dorsal aspect of right wrist. Psychiatric: Mood and affect are normal. Speech and behavior are normal.  ____________________________________________   LABS (all labs ordered are listed, but only abnormal results are displayed)  Labs Reviewed - No data to display ____________________________________________  EKG   ____________________________________________  RADIOLOGY  ED MD interpretation:    Official radiology report(s): No results found.  ____________________________________________   PROCEDURES  Procedure(s) performed (including Critical Care):  Procedures   ____________________________________________   INITIAL IMPRESSION / ASSESSMENT AND PLAN / ED COURSE  As part of my medical decision making, I reviewed the following data within the [facility]     Patient presents with reaction to a bee sting which occurred prior to arrival.  Patient states took Benadryl immediately after the incident and most of her complaint has resolved.  Discussed sequela of bee sting allergic reaction can lead to anaphylactic.  Patient advised to have an EpiPen on hand since her job requires close contact with bees.  Patient given discharge care instruction a prescription for Atarax.    Anne Casey was evaluated in Emergency Department on 03/29/2020 for the symptoms described in the history of present illness. She was evaluated in the context of the global COVID-19 pandemic, which necessitated consideration that the patient might be at risk for infection with the SARS-CoV-2 virus that causes COVID-19. Institutional protocols and algorithms that pertain to the evaluation of patients at risk for COVID-19 are in a state of rapid change based on information  released by regulatory bodies including the CDC and federal and state organizations. These policies and algorithms were followed during the patient's care in the ED.       ____________________________________________   FINAL CLINICAL IMPRESSION(S) / ED DIAGNOSES  Final diagnoses:  Bee sting reaction, accidental or unintentional, initial encounter     ED Discharge Orders         Ordered    EPINEPHrine (EPIPEN 2-PAK) 0.3 mg/0.3 mL IJ SOAJ injection  As needed     Discontinue  Reprint     03/29/20 1246    hydrOXYzine (ATARAX/VISTARIL) 50 MG tablet  3 times daily PRN     Discontinue  Reprint     03/29/20 1246           Note:  This document was prepared using Dragon voice recognition software and may include unintentional dictation errors.    Sable Feil, PA-C 03/29/20 1253    Arta Silence, MD 03/29/20 1440

## 2020-03-29 NOTE — ED Triage Notes (Signed)
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  Swelling to right hand seen.  Patient states she took benadryl at home, PTA.  C/O chest tightness.  AAOx3.Voice clear and strong.  NAD

## 2020-03-29 NOTE — ED Triage Notes (Signed)
Patient to the ER for c/o bee sting to right hand this am. Patient states she developed redness to right hand, left hand, and face/ears. 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 Patient states she developed chest tightness and shortness of breath. Patient took Benadryl at home (child's dose) and reports some relief of symptoms. Patient able to speak in complete sentences without difficulty.

## 2020-05-22 ENCOUNTER — Other Ambulatory Visit: Payer: Self-pay | Admitting: Family Medicine

## 2020-06-30 ENCOUNTER — Other Ambulatory Visit: Payer: Self-pay

## 2020-06-30 ENCOUNTER — Ambulatory Visit (INDEPENDENT_AMBULATORY_CARE_PROVIDER_SITE_OTHER): Payer: BC Managed Care – PPO | Admitting: Cardiology

## 2020-06-30 ENCOUNTER — Encounter: Payer: Self-pay | Admitting: Cardiology

## 2020-06-30 VITALS — BP 131/84 | HR 75 | Ht 67.0 in | Wt 256.1 lb

## 2020-06-30 DIAGNOSIS — I471 Supraventricular tachycardia: Secondary | ICD-10-CM | POA: Diagnosis not present

## 2020-06-30 DIAGNOSIS — E785 Hyperlipidemia, unspecified: Secondary | ICD-10-CM

## 2020-06-30 DIAGNOSIS — R002 Palpitations: Secondary | ICD-10-CM

## 2020-06-30 NOTE — Progress Notes (Signed)
Cardiology Office Note:    Date:  06/30/2020   ID:  Anne Casey, DOB July 22, 1971, MRN [id number]  PCP:  Pearline Cables, MD  Cardiologist:  Garwin Brothers, MD   Referring MD: Pearline Cables, MD    ASSESSMENT:    1. SVT (supraventricular tachycardia) (HCC)   2. Morbid obesity (HCC)   3. Palpitation    PLAN:    In order of problems listed above:  1. Primary prevention stressed with the patient.  Importance of compliance with diet medication stressed and she vocalized understanding. 2. Supraventricular tachycardia: Paroxysmal in nature.  Well managed with flecainide.  Her EKG is stable today.  She is happy about it. 3. Morbid obesity: Diet was emphasized.  Weight reduction was stressed and she promises to do better. 4. Cholesterol screening: She requests blood work for cholesterol screening and we will get her back in the next few days for complete blood work. 5. Patient will be seen in follow-up appointment in 6 months or earlier if the patient has any concerns    Medication Adjustments/Labs and Tests Ordered: Current medicines are reviewed at length with the patient today.  Concerns regarding medicines are outlined above.  No orders of the defined types were placed in this encounter.  No orders of the defined types were placed in this encounter.    No chief complaint on file.    History of Present Illness:    Anne Casey is a 49 y.o. female.  Patient has past medical history of supraventricular tachycardia and palpitations.  She denies any problems at this time and takes care of activities of daily living.  No chest pain orthopnea or PND.  She is tolerating flecainide well and is happy that palpitations have resolved.  At the time of my evaluation, the patient is alert awake oriented and in no distress.  Past Medical History:  Diagnosis Date  . High risk medication use 05/29/2019  . History of Holter monitoring 09/2017   sinus tachycardia  . Morbid obesity  (HCC) 12/29/2019  . Palpitation   . SVT (supraventricular tachycardia) (HCC)     Past Surgical History:  Procedure Laterality Date  . NO PAST SURGERIES      Current Medications: Current Meds  Medication Sig  . EPINEPHrine (EPIPEN 2-PAK) 0.3 mg/0.3 mL IJ SOAJ injection Inject 0.3 mLs (0.3 mg total) into the muscle as needed for anaphylaxis.  . flecainide (TAMBOCOR) 50 MG tablet TAKE 1 TABLET(50 MG) BY MOUTH TWICE DAILY  . metoprolol tartrate (LOPRESSOR) 25 MG tablet TAKE 1 TABLET(25 MG) BY MOUTH TWICE DAILY     Allergies:   Patient has no known allergies.   Social History   Socioeconomic History  . Marital status: Single    Spouse name: Not on file  . Number of children: Not on file  . Years of education: Not on file  . Highest education level: Not on file  Occupational History  . Not on file  Tobacco Use  . Smoking status: Never Smoker  . Smokeless tobacco: Never Used  Vaping Use  . Vaping Use: Never used  Substance and Sexual Activity  . Alcohol use: No  . Drug use: No  . Sexual activity: Not on file  Other Topics Concern  . Not on file  Social History Narrative  . Not on file   Social Determinants of Health   Financial Resource Strain:   . Difficulty of Paying Living Expenses: Not on file  Food Insecurity:   .  Worried About Programme researcher, broadcasting/film/video in the Last Year: Not on file  . Ran Out of Food in the Last Year: Not on file  Transportation Needs:   . Lack of Transportation (Medical): Not on file  . Lack of Transportation (Non-Medical): Not on file  Physical Activity:   . Days of Exercise per Week: Not on file  . Minutes of Exercise per Session: Not on file  Stress:   . Feeling of Stress : Not on file  Social Connections:   . Frequency of Communication with Friends and Family: Not on file  . Frequency of Social Gatherings with Friends and Family: Not on file  . Attends Religious Services: Not on file  . Active Member of Clubs or Organizations: Not on file    . Attends Banker Meetings: Not on file  . Marital Status: Not on file     Family History: The patient's family history includes Colon cancer in her mother; Congestive Heart Failure in her father; Heart Problems in her sister; Hypertension in her mother.  ROS:   Please see the history of present illness.    All other systems reviewed and are negative.  EKGs/Labs/Other Studies Reviewed:    The following studies were reviewed today: EKG reveals sinus rhythm and nonspecific ST-T changes   Recent Labs: 12/29/2019: BUN 13; Creatinine, Ser 0.69; Potassium 4.4; Sodium 140; TSH 0.839  Recent Lipid Panel    Component Value Date/Time   CHOL 176 04/30/2019 1341   CHOL 188 12/06/2016 1155   TRIG 181.0 (H) 04/30/2019 1341   HDL 43.10 04/30/2019 1341   HDL 41 12/06/2016 1155   CHOLHDL 4 04/30/2019 1341   VLDL 36.2 04/30/2019 1341   LDLCALC 96 04/30/2019 1341   LDLCALC 108 (H) 12/06/2016 1155    Physical Exam:    VS:  BP 131/84   Pulse 75   Ht 5\' 7"  (1.702 m)   Wt 256 lb 1.3 oz (116.2 kg)   SpO2 96%   BMI 40.11 kg/m     Wt Readings from Last 3 Encounters:  06/30/20 256 lb 1.3 oz (116.2 kg)  03/29/20 258 lb (117 kg)  12/29/19 261 lb (118.4 kg)     GEN: Patient is in no acute distress HEENT: Normal NECK: No JVD; No carotid bruits LYMPHATICS: No lymphadenopathy CARDIAC: Hear sounds regular, 2/6 systolic murmur at the apex. RESPIRATORY:  Clear to auscultation without rales, wheezing or rhonchi  ABDOMEN: Soft, non-tender, non-distended MUSCULOSKELETAL:  No edema; No deformity  SKIN: Warm and dry NEUROLOGIC:  Alert and oriented x 3 PSYCHIATRIC:  Normal affect   Signed, 12/31/19, MD  06/30/2020 2:59 PM    [facility]

## 2020-06-30 NOTE — Addendum Note (Signed)
Addended by: Eleonore Chiquito on: 06/30/2020 03:20 PM   Modules accepted: Orders

## 2020-06-30 NOTE — Patient Instructions (Signed)
Medication Instructions:  No medication changes. *If you need a refill on your cardiac medications before your next appointment, please call your pharmacy*   Lab Work: Your physician recommends that you have labs done in the office in the next few days.. Your test included  basic metabolic panel, complete blood count, TSH, liver function and lipids. You need to have labs done when you are fasting.  You can come Monday through Friday 8:30 am to 12:00 pm and 1:15 to 4:30. You do not need to make an appointment as the order has already been placed.   If you have labs (blood work) drawn today and your tests are completely normal, you will receive your results only by: Marland Kitchen MyChart Message (if you have MyChart) OR . A paper copy in the mail If you have any lab test that is abnormal or we need to change your treatment, we will call you to review the results.   Testing/Procedures: None ordered   Follow-Up: At [facility], you and your health needs are our priority.  As part of our continuing mission to provide you with exceptional heart care, we have created designated Provider Care Teams.  These Care Teams include your primary Cardiologist (physician) and Advanced Practice Providers (APPs -  Physician Assistants and Nurse Practitioners) who all work together to provide you with the care you need, when you need it.  We recommend signing up for the patient portal called "MyChart".  Sign up information is provided on this After Visit Summary.  MyChart is used to connect with patients for Virtual Visits (Telemedicine).  Patients are able to view lab/test results, encounter notes, upcoming appointments, etc.  Non-urgent messages can be sent to your provider as well.   To learn more about what you can do with MyChart, go to [email or web address].    Your next appointment:   6 month(s)  The format for your next appointment:   In Person  Provider:   Belva Crome, MD   Other  Instructions NA

## 2020-07-09 DIAGNOSIS — I471 Supraventricular tachycardia: Secondary | ICD-10-CM | POA: Diagnosis not present

## 2020-07-09 DIAGNOSIS — R002 Palpitations: Secondary | ICD-10-CM | POA: Diagnosis not present

## 2020-07-10 LAB — CBC WITH DIFFERENTIAL/PLATELET
Basophils Absolute: 0 10*3/uL (ref 0.0–0.2)
Basos: 0 %
EOS (ABSOLUTE): 0.1 10*3/uL (ref 0.0–0.4)
Eos: 1 %
Hematocrit: 41.3 % (ref 34.0–46.6)
Hemoglobin: 13.7 g/dL (ref 11.1–15.9)
Immature Grans (Abs): 0 10*3/uL (ref 0.0–0.1)
Immature Granulocytes: 0 %
Lymphocytes Absolute: 1.8 10*3/uL (ref 0.7–3.1)
Lymphs: 23 %
MCH: 28.8 pg (ref 26.6–33.0)
MCHC: 33.2 g/dL (ref 31.5–35.7)
MCV: 87 fL (ref 79–97)
Monocytes Absolute: 0.5 10*3/uL (ref 0.1–0.9)
Monocytes: 7 %
Neutrophils Absolute: 5.3 10*3/uL (ref 1.4–7.0)
Neutrophils: 69 %
Platelets: 220 10*3/uL (ref 150–450)
RBC: 4.76 x10E6/uL (ref 3.77–5.28)
RDW: 12.8 % (ref 11.7–15.4)
WBC: 7.7 10*3/uL (ref 3.4–10.8)

## 2020-07-10 LAB — BASIC METABOLIC PANEL
BUN/Creatinine Ratio: 13 (ref 9–23)
BUN: 8 mg/dL (ref 6–24)
CO2: 21 mmol/L (ref 20–29)
Calcium: 9.1 mg/dL (ref 8.7–10.2)
Chloride: 103 mmol/L (ref 96–106)
Creatinine, Ser: 0.63 mg/dL (ref 0.57–1.00)
GFR calc Af Amer: 121 mL/min/{1.73_m2} (ref >59)
GFR calc non Af Amer: 105 mL/min/{1.73_m2} (ref >59)
Glucose: 106 mg/dL — ABNORMAL HIGH (ref 65–99)
Potassium: 4.2 mmol/L (ref 3.5–5.2)
Sodium: 138 mmol/L (ref 134–144)

## 2020-07-10 LAB — LIPID PANEL
Chol/HDL Ratio: 4.8 ratio — ABNORMAL HIGH (ref 0.0–4.4)
Cholesterol, Total: 181 mg/dL (ref 100–199)
HDL: 38 mg/dL — ABNORMAL LOW (ref >39)
LDL Chol Calc (NIH): 108 mg/dL — ABNORMAL HIGH (ref 0–99)
Triglycerides: 198 mg/dL — ABNORMAL HIGH (ref 0–149)
VLDL Cholesterol Cal: 35 mg/dL (ref 5–40)

## 2020-07-10 LAB — HEPATIC FUNCTION PANEL
ALT: 19 IU/L (ref 0–32)
AST: 14 IU/L (ref 0–40)
Albumin: 4.5 g/dL (ref 3.8–4.8)
Alkaline Phosphatase: 76 IU/L (ref 44–121)
Bilirubin Total: 0.4 mg/dL (ref 0.0–1.2)
Bilirubin, Direct: 0.1 mg/dL (ref 0.00–0.40)
Total Protein: 7.3 g/dL (ref 6.0–8.5)

## 2020-07-10 LAB — TSH: TSH: 0.974 u[IU]/mL (ref 0.450–4.500)

## 2020-07-12 NOTE — Addendum Note (Signed)
Addended by: Eleonore Chiquito on: 07/12/2020 11:25 AM   Modules accepted: Orders

## 2020-07-17 ENCOUNTER — Other Ambulatory Visit: Payer: Self-pay | Admitting: Cardiology

## 2020-07-19 NOTE — Telephone Encounter (Signed)
*  STAT* If patient is at the pharmacy, call can be transferred to refill team.   1. Which medications need to be refilled? (please list name of each medication and dose if known)  flecainide (TAMBOCOR) 50 MG tablet  2. Which pharmacy/location (including street and city if local pharmacy) is medication to be sent to? [id number] - [place], [place] - [address]  3. Do they need a 30 day or 90 day supply? 90 day   Has been out of medication since Friday 07/16/20.

## 2020-07-19 NOTE — Telephone Encounter (Signed)
    Pt is calling back to follow up, she said she really need the refill today

## 2020-09-08 ENCOUNTER — Other Ambulatory Visit: Payer: Self-pay

## 2020-09-08 ENCOUNTER — Emergency Department
Admission: EM | Admit: 2020-09-08 | Discharge: 2020-09-08 | Disposition: A | Discharge status: Home / Self Care | Payer: BC Managed Care – PPO | Attending: Student in an Organized Health Care Education/Training Program | Admitting: Student in an Organized Health Care Education/Training Program

## 2020-09-08 DIAGNOSIS — R002 Palpitations: Secondary | ICD-10-CM | POA: Diagnosis not present

## 2020-09-08 DIAGNOSIS — I471 Supraventricular tachycardia: Secondary | ICD-10-CM | POA: Insufficient documentation

## 2020-09-08 LAB — CBC WITH DIFFERENTIAL/PLATELET
Abs Immature Granulocytes: 0.03 10*3/uL (ref 0.00–0.07)
Basophils Absolute: 0 10*3/uL (ref 0.0–0.1)
Basophils Relative: 0 %
Eosinophils Absolute: 0.1 10*3/uL (ref 0.0–0.5)
Eosinophils Relative: 1 %
HCT: 40.3 % (ref 36.0–46.0)
Hemoglobin: 13.4 g/dL (ref 12.0–15.0)
Immature Granulocytes: 0 %
Lymphocytes Relative: 37 %
Lymphs Abs: 3.8 10*3/uL (ref 0.7–4.0)
MCH: 29.4 pg (ref 26.0–34.0)
MCHC: 33.3 g/dL (ref 30.0–36.0)
MCV: 88.4 fL (ref 80.0–100.0)
Monocytes Absolute: 0.8 10*3/uL (ref 0.1–1.0)
Monocytes Relative: 8 %
Neutro Abs: 5.5 10*3/uL (ref 1.7–7.7)
Neutrophils Relative %: 54 %
Platelets: 241 10*3/uL (ref 150–400)
RBC: 4.56 MIL/uL (ref 3.87–5.11)
RDW: 13.1 % (ref 11.5–15.5)
WBC: 10.3 10*3/uL (ref 4.0–10.5)
nRBC: 0.2 % (ref 0.0–0.2)

## 2020-09-08 LAB — COMPREHENSIVE METABOLIC PANEL
ALT: 20 U/L (ref 0–44)
AST: 21 U/L (ref 15–41)
Albumin: 4.1 g/dL (ref 3.5–5.0)
Alkaline Phosphatase: 73 U/L (ref 38–126)
Anion gap: 10 (ref 5–15)
BUN: 13 mg/dL (ref 6–20)
CO2: 24 mmol/L (ref 22–32)
Calcium: 8.9 mg/dL (ref 8.9–10.3)
Chloride: 105 mmol/L (ref 98–111)
Creatinine, Ser: 0.75 mg/dL (ref 0.44–1.00)
GFR, Estimated: >60 mL/min (ref >60)
Glucose, Bld: 147 mg/dL — ABNORMAL HIGH (ref 70–99)
Potassium: 3.6 mmol/L (ref 3.5–5.1)
Sodium: 139 mmol/L (ref 135–145)
Total Bilirubin: 0.5 mg/dL (ref 0.3–1.2)
Total Protein: 7.4 g/dL (ref 6.5–8.1)

## 2020-09-08 MED ORDER — ADENOSINE 6 MG/2ML IV SOLN: 6.0000 mg | Freq: Once | INTRAVENOUS | Status: AC

## 2020-09-08 MED ORDER — SODIUM CHLORIDE 0.9 % IV BOLUS
1000.0000 mL | Freq: Once | INTRAVENOUS | Status: AC
  Administered 2020-09-08: 1000 mL via INTRAVENOUS

## 2020-09-08 MED ORDER — METOPROLOL TARTRATE 5 MG/5ML IV SOLN: 5.0000 mg | Freq: Once | INTRAVENOUS | Status: AC

## 2020-09-08 MED ORDER — ADENOSINE 6 MG/2ML IV SOLN
INTRAVENOUS | Status: AC
  Administered 2020-09-08: 6 mg via INTRAVENOUS
  Filled 2020-09-08: qty 2

## 2020-09-08 MED ORDER — METOPROLOL TARTRATE 25 MG PO TABS
25.0000 mg | ORAL_TABLET | Freq: Once | ORAL | Status: AC
  Administered 2020-09-08: 25 mg via ORAL
  Filled 2020-09-08: qty 1

## 2020-09-08 MED ORDER — ADENOSINE 12 MG/4ML IV SOLN
INTRAVENOUS | Status: AC
  Filled 2020-09-08: qty 4

## 2020-09-08 MED ORDER — METOPROLOL TARTRATE 5 MG/5ML IV SOLN
INTRAVENOUS | Status: AC
  Administered 2020-09-08: 5 mg via INTRAVENOUS
  Filled 2020-09-08: qty 5

## 2020-09-08 NOTE — ED Triage Notes (Signed)
Patient reports sudden onset of chest tightness and palpitations since 1650 today. Hx of afib. Patient A&OX3.

## 2020-09-08 NOTE — ED Notes (Signed)
EKG preformed, HR 184, Sam, RN notified that pt needed a rm per MD Roxan Hockey. Pt taken back to rm and placed on cardiac monitor. Zella Ball, RN and MD Roxan Hockey at bedside.

## 2020-09-08 NOTE — ED Provider Notes (Signed)
[facility] [place] Emergency Department Provider Note    First MD Initiated Contact with Patient 09/08/20 1840     (approximate)  I have reviewed the triage vital signs and the nursing notes.   HISTORY  Chief Complaint Palpitations    HPI HALO SHEVLIN is a 49 y.o. female below listed past medical history presents to the ER for evaluation of palpitations and chest tightness started around 450.  She is on metoprolol as well as flecainide for history of tachycardia as well as SVT.  Feels the same.  Denies any recent fevers or chills.  No nausea or vomiting.  She took her home meds without any improvement.    Past Medical History:  Diagnosis Date  . High risk medication use 05/29/2019  . History of Holter monitoring 09/2017   sinus tachycardia  . Morbid obesity (HCC) 12/29/2019  . Palpitation   . SVT (supraventricular tachycardia) (HCC)    Family History  Problem Relation Age of Onset  . Hypertension Mother   . Colon cancer Mother   . Congestive Heart Failure Father   . Heart Problems Sister    Past Surgical History:  Procedure Laterality Date  . NO PAST SURGERIES     Patient Active Problem List   Diagnosis Date Noted  . Morbid obesity (HCC) 12/29/2019  . High risk medication use 05/29/2019  . History of Holter monitoring 09/2017  . Palpitation   . SVT (supraventricular tachycardia) (HCC) 08/09/2012      Prior to Admission medications   Medication Sig Start Date End Date Taking? Authorizing Provider  EPINEPHrine (EPIPEN 2-PAK) 0.3 mg/0.3 mL IJ SOAJ injection Inject 0.3 mLs (0.3 mg total) into the muscle as needed for anaphylaxis. 03/29/20   Joni Reining, PA-C  flecainide (TAMBOCOR) 50 MG tablet TAKE 1 TABLET(50 MG) BY MOUTH TWICE DAILY 07/19/20   Revankar, Aundra Dubin, MD  metoprolol tartrate (LOPRESSOR) 25 MG tablet TAKE 1 TABLET(25 MG) BY MOUTH TWICE DAILY 05/24/20   Copland, Gwenlyn Found, MD    Allergies Patient has no known  allergies.    Social History Social History   Tobacco Use  . Smoking status: Never Smoker  . Smokeless tobacco: Never Used  Vaping Use  . Vaping Use: Never used  Substance Use Topics  . Alcohol use: No  . Drug use: No    Review of Systems Patient denies headaches, rhinorrhea, blurry vision, numbness, shortness of breath, chest pain, edema, cough, abdominal pain, nausea, vomiting, diarrhea, dysuria, fevers, rashes or hallucinations unless otherwise stated above in HPI. ____________________________________________   PHYSICAL EXAM:  VITAL SIGNS: Vitals:   09/08/20 1843 09/08/20 1846  BP: (!) 145/98   Pulse: (!) 180   Resp: (!) 28   Temp:  97.7 F (36.5 C)  SpO2: 98%     Constitutional: Alert and oriented.  Eyes: Conjunctivae are normal.  Head: Atraumatic. Nose: No congestion/rhinnorhea. Mouth/Throat: Mucous membranes are moist.   Neck: No stridor. Painless ROM.  Cardiovascular: markedly tachycardic,  regular rhythm. Grossly normal heart sounds.  Good peripheral circulation. Respiratory: Normal respiratory effort.  No retractions. Lungs CTAB. Gastrointestinal: Soft and nontender. No distention. No abdominal bruits. No CVA tenderness. Genitourinary:  Musculoskeletal: No lower extremity tenderness nor edema.  No joint effusions. Neurologic:  Normal speech and language. No gross focal neurologic deficits are appreciated. No facial droop Skin:  Skin is warm, dry and intact. No rash noted. Psychiatric: Mood and affect are normal. Speech and behavior are normal.  ____________________________________________  LABS (all labs ordered are listed, but only abnormal results are displayed)  Results for orders placed or performed during the hospital encounter of 09/08/20 (from the past 24 hour(s))  CBC with Differential     Status: None   Collection Time: 09/08/20  6:46 PM  Result Value Ref Range   WBC 10.3 4.0 - 10.5 K/uL   RBC 4.56 3.87 - 5.11 MIL/uL   Hemoglobin 13.4  12.0 - 15.0 g/dL   HCT 03.5 36 - 46 %   MCV 88.4 80.0 - 100.0 fL   MCH 29.4 26.0 - 34.0 pg   MCHC 33.3 30.0 - 36.0 g/dL   RDW 59.7 41.6 - 38.4 %   Platelets 241 150 - 400 K/uL   nRBC 0.2 0.0 - 0.2 %   Neutrophils Relative % 54 %   Neutro Abs 5.5 1.7 - 7.7 K/uL   Lymphocytes Relative 37 %   Lymphs Abs 3.8 0.7 - 4.0 K/uL   Monocytes Relative 8 %   Monocytes Absolute 0.8 0.1 - 1.0 K/uL   Eosinophils Relative 1 %   Eosinophils Absolute 0.1 0.0 - 0.5 K/uL   Basophils Relative 0 %   Basophils Absolute 0.0 0.0 - 0.1 K/uL   Immature Granulocytes 0 %   Abs Immature Granulocytes 0.03 0.00 - 0.07 K/uL  Comprehensive metabolic panel     Status: Abnormal   Collection Time: 09/08/20  6:46 PM  Result Value Ref Range   Sodium 139 135 - 145 mmol/L   Potassium 3.6 3.5 - 5.1 mmol/L   Chloride 105 98 - 111 mmol/L   CO2 24 22 - 32 mmol/L   Glucose, Bld 147 (H) 70 - 99 mg/dL   BUN 13 6 - 20 mg/dL   Creatinine, Ser 5.36 0.44 - 1.00 mg/dL   Calcium 8.9 8.9 - 46.8 mg/dL   Total Protein 7.4 6.5 - 8.1 g/dL   Albumin 4.1 3.5 - 5.0 g/dL   AST 21 15 - 41 U/L   ALT 20 0 - 44 U/L   Alkaline Phosphatase 73 38 - 126 U/L   Total Bilirubin 0.5 0.3 - 1.2 mg/dL   GFR, Estimated >03 >21 mL/min   Anion gap 10 5 - 15   ____________________________________________  EKG  My review and personal interpretation at Time: 18:35   Indication: palpitations  Rate: 185  Rhythm: svt Axis: normal Other: nonspecific st abn likely rate dependent  My review and personal interpretation at Time: 18:54   Indication: post cardioversion Rate: 110  Rhythm: sinus Axis: normal Other: normal intervals, no stemi, nodepression  ____________________________________________  RADIOLOGY   ____________________________________________   PROCEDURES  Procedure(s) performed:  .Critical Care Performed by: Willy Eddy, MD Authorized by: Willy Eddy, MD   Critical care provider statement:    Critical care time  (minutes):  32   Critical care time was exclusive of:  Separately billable procedures and treating other patients   Critical care was necessary to treat or prevent imminent or life-threatening deterioration of the following conditions:  Cardiac failure   Critical care was time spent personally by me on the following activities:  Development of treatment plan with patient or surrogate, discussions with consultants, evaluation of patient's response to treatment, examination of patient, obtaining history from patient or surrogate, ordering and performing treatments and interventions, ordering and review of laboratory studies, ordering and review of radiographic studies, pulse oximetry, re-evaluation of patient's condition and review of old charts      Critical Care performed: yes  ____________________________________________   INITIAL IMPRESSION / ASSESSMENT AND PLAN / ED COURSE  Pertinent labs & imaging results that were available during my care of the patient were reviewed by me and considered in my medical decision making (see chart for details).   DDX: Dysrhythmia, electrolyte abnormality, dehydration, sepsis, medication effect  AVYANNA SPADA is a 49 y.o. who presents to the ED with presentation as described above afebrile but markedly tachycardic normotensive protecting her airway. Presentation consistent with acute SVT causing her symptoms. Discussed my recommendation for plan to chemical cardioversion with IV adenosine. Patient given IV fluids. Patient agreeable to plan.  Clinical Course as of Sep 08 2013  Wed Sep 08, 2020  [id number] Patient successfully cardioverted with adenosine to a sinus rhythm still tachycardic in 1 teens therefore given IV Lopressor. We will continue with IV fluids and continue to monitor. She feels much improved.   [PR]  2010 Patient reassessed.  Feels well at this point.  I am going recommend that she increase her metoprolol.  She seems to be tolerating this well.    She has been on the monitor over 1 hour without any signs of dysrhythmia and is otherwise asymptomatic and this is a known issue for her I think she is appropriate for discharge home.   [PR]    Clinical Course User Index [PR] Willy Eddy, MD    The patient was evaluated in Emergency Department today for the symptoms described in the history of present illness. He/she was evaluated in the context of the global COVID-19 pandemic, which necessitated consideration that the patient might be at risk for infection with the SARS-CoV-2 virus that causes COVID-19. Institutional protocols and algorithms that pertain to the evaluation of patients at risk for COVID-19 are in a state of rapid change based on information released by regulatory bodies including the CDC and federal and state organizations. These policies and algorithms were followed during the patient's care in the ED.  As part of my medical decision making, I reviewed the following data within the electronic MEDICAL RECORD NUMBER Nursing notes reviewed and incorporated, Labs reviewed, notes from prior ED visits and [place] Controlled Substance Database   ____________________________________________   FINAL CLINICAL IMPRESSION(S) / ED DIAGNOSES  Final diagnoses:  SVT (supraventricular tachycardia) (HCC)      NEW MEDICATIONS STARTED DURING THIS VISIT:  New Prescriptions   No medications on file     Note:  This document was prepared using Dragon voice recognition software and may include unintentional dictation errors.    Willy Eddy, MD 09/08/20 2014

## 2020-09-08 NOTE — ED Notes (Signed)
Patient in NSR, EKG performed and given to Dr. Roxan Hockey at bedside. Patient tolerated well.

## 2020-09-08 NOTE — Discharge Instructions (Addendum)
Please increase your metoprolol from 25mg  twice daily to 32.5mg  (1.5 pills) twice daily.  Please follow up with cardiology.  Return for any additional questions or concerns.

## 2020-09-08 NOTE — ED Notes (Addendum)
Pt taken to room 7 at this time via WC by EDT Tracey. RN Zella Ball notified.

## 2020-09-08 NOTE — ED Notes (Signed)
Patient in SVT, Dr. Roxan Hockey at bedside. Patient placed on continuous cardiac monitoring, zoll monitor at bedside, pads placed on patient, IV in place, patient gave verbal consent to receive adenosine, Zac RN at bedside to assist.

## 2020-09-17 ENCOUNTER — Telehealth: Payer: Self-pay | Admitting: Family Medicine

## 2020-09-17 MED ORDER — METOPROLOL TARTRATE 25 MG PO TABS: ORAL_TABLET | ORAL | 1 refills | Status: DC

## 2020-09-17 NOTE — Telephone Encounter (Signed)
Patient states she was seeing at the emergency room and they increase her medication. I schedule a follow up appt for 09/23/20. Patient need refill  metoprolol tartrate (LOPRESSOR) 25 MG tablet   1 1/2 table by mouth twice daily (new dosage)  [id number] [place], [place] - [address] AT [facility] [address]  [address] [place], [place] [address]  Phone:  [phone] Fax:  [phone]

## 2020-09-17 NOTE — Telephone Encounter (Signed)
Medication sent to pharmacy  

## 2020-09-22 NOTE — Progress Notes (Addendum)
[facility] [employer] [address] [place], [place] [address] [phone]  Date:  09/23/2020   Name:  Anne Casey   DOB:  Dec 28, 1970   MRN:  [id number]  PCP:  Pearline Cables, MD    Chief Complaint: Medication Managment and Headache (Dizziness with headache/)   History of Present Illness:  Anne Casey is a 49 y.o. very pleasant female patient who presents with the following:  Patient here today for medication follow-up Last seen by myself in July 2020 History of palpitations, SVT Her mother died in 02-25-2018 with metastatic colon cancer Colon cancer screening:  This has not yet been done, she is now willing to see GI, she would prefer to see a female  Mammogram- pt declines to have me order  Pap smear- pt declines to do today  Flu vaccine- she is willing to do a flu shot  COVID-19 vaccine- encouraged her to do this asap   She was seen in the ER at [facility] last month with concern for palpitation and chest tightness-she was found to have acute SVT.  She had sx for about 2 hours and decided to be seen.  She was converted to sinus with adenosine, released with increased dose of metoprolol She still notes an occasional flutter for less than a minute  Her primary cardiologist is Dr. Tomie China here at the med center- she has an appt to see him in the spring. She is seen every 6 months   Donnesha also notes that she sometimes feels dizzy or lightheaded, she may have headaches.  She has read this could be side effects of her flecainide, she wonders about a possible medication change  Patient Active Problem List   Diagnosis Date Noted  . Morbid obesity (HCC) 12/29/2019  . High risk medication use 05/29/2019  . History of Holter monitoring 09/2017  . Palpitation   . SVT (supraventricular tachycardia) (HCC) 08/09/2012    Past Medical History:  Diagnosis Date  . High risk medication use 05/29/2019  . History of Holter  monitoring 09/2017   sinus tachycardia  . Morbid obesity (HCC) 12/29/2019  . Palpitation   . SVT (supraventricular tachycardia) (HCC)     Past Surgical History:  Procedure Laterality Date  . NO PAST SURGERIES      Social History   Tobacco Use  . Smoking status: Never Smoker  . Smokeless tobacco: Never Used  Vaping Use  . Vaping Use: Never used  Substance Use Topics  . Alcohol use: No  . Drug use: No    Family History  Problem Relation Age of Onset  . Hypertension Mother   . Colon cancer Mother   . Congestive Heart Failure Father   . Heart Problems Sister     No Known Allergies  Medication list has been reviewed and updated.  Current Outpatient Medications on File Prior to Visit  Medication Sig Dispense Refill  . EPINEPHrine (EPIPEN 2-PAK) 0.3 mg/0.3 mL IJ SOAJ injection Inject 0.3 mLs (0.3 mg total) into the muscle as needed for anaphylaxis. 1 each 1  . flecainide (TAMBOCOR) 50 MG tablet TAKE 1 TABLET(50 MG) BY MOUTH TWICE DAILY 180 tablet 3  . metoprolol tartrate (LOPRESSOR) 25 MG tablet TAKE 1.5 TABLET(25 MG) BY MOUTH TWICE DAILY 180 tablet 1   No current facility-administered medications on file prior to visit.    Review of Systems:  As per HPI- otherwise negative.   Physical Examination:  Vitals:   09/23/20 1344 09/23/20 1356  BP: (!) 160/92 (!) 148/88  Pulse: 65   Resp: 15   SpO2: 98%    Vitals:   09/23/20 1344  Weight: 259 lb (117.5 kg)  Height: 5\' 7"  (1.702 m)   Body mass index is 40.57 kg/m. Ideal Body Weight: Weight in (lb) to have BMI = 25: 159.3  GEN: no acute distress.  Obese, looks well  HEENT: Atraumatic, Normocephalic.   Bilateral TM wnl, PEERL,EOMI.   Ears and Nose: No external deformity. CV: RRR, No M/G/R. No JVD. No thrill. No extra heart sounds. PULM: CTA B, no wheezes, crackles, rhonchi. No retractions. No resp. distress. No accessory muscle use. ABD: S, NT, ND, +BS. No rebound. No HSM. EXTR: No c/c/e PSYCH: Normally  interactive. Conversant.   BP Readings from Last 3 Encounters:  09/23/20 (!) 148/88  09/08/20 126/78  06/30/20 131/84    Assessment and Plan: Colon cancer screening - Plan: Ambulatory referral to Gastroenterology  SVT (supraventricular tachycardia) (HCC)  Immunization due  Elevated glucose - Plan: Hemoglobin A1c  Needs flu shot - Plan: Flu Vaccine QUAD 6+ mos PF IM (Fluarix Quad PF)  Pt here today with a couple of concerns Flu vaccine given Referral to GI for colon cancer screening She has been in the ER with SVT twice since last cardiology visit She also is having some possible SE of her flecainide-especially as she is having breakthrough SVT wonders about a medication change-I will touch base with her cardiologist for her This visit occurred during the SARS-CoV-2 public health emergency.  Safety protocols were in place, including screening questions prior to the visit, additional usage of staff PPE, and extensive cleaning of exam room while observing appropriate contact time as indicated for disinfecting solutions.    Signed 07/02/20, MD  Received her A1c as below, 12/11-letter to patient  Results for orders placed or performed in visit on 09/23/20  Hemoglobin A1c  Result Value Ref Range   Hgb A1c MFr Bld 5.6 4.6 - 6.5 %

## 2020-09-22 NOTE — Patient Instructions (Addendum)
It was good to see you again today!  I strongly recommend that we catch up on preventative services for you including mammogram, colonoscopy, Pap smear- we will refer you to see a female GI doctor for screening colonoscopy Flu shot today!   I do encourage you to get your covid 19 vaccine as well  I will get in touch with Dr Tomie China for you about your medications and symptoms Happy holidays!

## 2020-09-23 ENCOUNTER — Other Ambulatory Visit: Payer: Self-pay

## 2020-09-23 ENCOUNTER — Ambulatory Visit (INDEPENDENT_AMBULATORY_CARE_PROVIDER_SITE_OTHER): Payer: BC Managed Care – PPO | Admitting: Family Medicine

## 2020-09-23 ENCOUNTER — Encounter: Payer: Self-pay | Admitting: Family Medicine

## 2020-09-23 VITALS — BP 148/88 | HR 65 | Resp 15 | Ht 67.0 in | Wt 259.0 lb

## 2020-09-23 DIAGNOSIS — I471 Supraventricular tachycardia: Secondary | ICD-10-CM

## 2020-09-23 DIAGNOSIS — Z1211 Encounter for screening for malignant neoplasm of colon: Secondary | ICD-10-CM

## 2020-09-23 DIAGNOSIS — R7309 Other abnormal glucose: Secondary | ICD-10-CM | POA: Diagnosis not present

## 2020-09-23 DIAGNOSIS — Z23 Encounter for immunization: Secondary | ICD-10-CM

## 2020-09-24 LAB — HEMOGLOBIN A1C: Hgb A1c MFr Bld: 5.6 % (ref 4.6–6.5)

## 2020-10-20 NOTE — Progress Notes (Addendum)
[facility] [address] [place], [place] [address] [phone]  Date:  10/21/2020   Name:  Anne Casey   DOB:  Dec 29, 1970   MRN:  [id number]  PCP:  Pearline Cables, MD    Chief Complaint: No chief complaint on file.   History of Present Illness:  Anne Casey is a 50 y.o. very pleasant female patient who presents with the following:  Pt with obesity, SVT-she is treated with flecainide and metoprolol Virtual visit today for covid 19 + test Pt location is home, provider location is office Pt ID confirmed with 2 factors, she gives consent for a virtual visit today  She became ill about 5 days ago She was tested for covid 2 days ago- results came back positive  She notes that she is feeling generally ok-her symptoms are relatively mild  Her current sx are fatigue, she is sleeping more than normal  She feels like her chronic varicose veins on her legs are "stiff and tight"- she is trying to walk more and moisturized her skin- this did help  The veins in her right leg are typically worse The leg is not swollen Temperature is now ok- no fever Cough is resolved She had a HA and body aches- improved now  Does not have any severe SOB- she may notice some chest tightness at times, but nothing persistent  She did not get vaccinated for covid 19 despite my strong recommendation that she do so At this time, she declines being referred for possible antibody treatment  No vomiting, some mild nausea No diarrhea   She is using OTC medication such as robitussin   Patient Active Problem List   Diagnosis Date Noted  . Morbid obesity (HCC) 12/29/2019  . High risk medication use 05/29/2019  . History of Holter monitoring 09/2017  . Palpitation   . SVT (supraventricular tachycardia) (HCC) 08/09/2012    Past Medical History:  Diagnosis Date  . High risk medication use 05/29/2019  . History of Holter monitoring  09/2017   sinus tachycardia  . Morbid obesity (HCC) 12/29/2019  . Palpitation   . SVT (supraventricular tachycardia) (HCC)     Past Surgical History:  Procedure Laterality Date  . NO PAST SURGERIES      Social History   Tobacco Use  . Smoking status: Never Smoker  . Smokeless tobacco: Never Used  Vaping Use  . Vaping Use: Never used  Substance Use Topics  . Alcohol use: No  . Drug use: No    Family History  Problem Relation Age of Onset  . Hypertension Mother   . Colon cancer Mother   . Congestive Heart Failure Father   . Heart Problems Sister     No Known Allergies  Medication list has been reviewed and updated.  Current Outpatient Medications on File Prior to Visit  Medication Sig Dispense Refill  . EPINEPHrine (EPIPEN 2-PAK) 0.3 mg/0.3 mL IJ SOAJ injection Inject 0.3 mLs (0.3 mg total) into the muscle as needed for anaphylaxis. 1 each 1  . flecainide (TAMBOCOR) 50 MG tablet TAKE 1 TABLET(50 MG) BY MOUTH TWICE DAILY 180 tablet 3  . metoprolol tartrate (LOPRESSOR) 25 MG tablet TAKE 1.5 TABLET(25 MG) BY MOUTH TWICE DAILY 180 tablet 1   No current facility-administered medications on file prior to visit.    Review of Systems:  As per HPI- otherwise negative.   Physical Examination: There were no vitals  filed for this visit. There were no vitals filed for this visit. There is no height or weight on file to calculate BMI. Ideal Body Weight:    Spoke to pt on the telephone only  She sounds well, her BP 135/79, pulse 65 She does not have a pulse oximeter but plans to try and get one if she can   Assessment and Plan: COVID-19 - Plan: aspirin EC 81 MG tablet  Rash and nonspecific skin eruption - Plan: triamcinolone (KENALOG) 0.1 %  Virtual visit today for COVID-19- pt has been ill for about 5 days and tested positive 2 days ago Her sx are relatively mild.  Advised rest, fliuds, OTC meds as needed, seek care if getting worse, isolate She is worried about  getting a DVT due to her varicose veins Advised ok to take asa OTC for a couple of weeks if she would like- would recommend 81 mg If she develops any discrete redness, heat, or swelling we can do an [place] for her She also notes a rash on her chest which has responded well to triamcinolone cream- would like a refill if possible  Spoke to pt for 12 mintues on phone  Signed Abbe Amsterdam, MD

## 2020-10-21 ENCOUNTER — Telehealth (INDEPENDENT_AMBULATORY_CARE_PROVIDER_SITE_OTHER): Payer: BC Managed Care – PPO | Admitting: Family Medicine

## 2020-10-21 ENCOUNTER — Other Ambulatory Visit: Payer: Self-pay

## 2020-10-21 DIAGNOSIS — U071 COVID-19: Secondary | ICD-10-CM | POA: Diagnosis not present

## 2020-10-21 DIAGNOSIS — R21 Rash and other nonspecific skin eruption: Secondary | ICD-10-CM

## 2020-10-21 MED ORDER — TRIAMCINOLONE ACETONIDE 0.1 % EX CREA: 1.0000 "application " | TOPICAL_CREAM | Freq: Two times a day (BID) | CUTANEOUS | 0 refills | Status: DC

## 2020-10-21 MED ORDER — ASPIRIN EC 81 MG PO TBEC: 81.0000 mg | DELAYED_RELEASE_TABLET | Freq: Every day | ORAL | 0 refills | Status: DC

## 2020-10-22 ENCOUNTER — Encounter: Payer: Self-pay | Admitting: Family Medicine

## 2020-10-23 NOTE — Progress Notes (Signed)
[facility] at [facility] [address] [place], [place] [address] [phone]  Date:  10/25/2020   Name:  Anne Casey   DOB:  [birth year]   MRN:  [id number]  PCP:  Pearline Cables, MD    Chief Complaint: right leg pain (Right leg, redness and pain, very painful/)   History of Present Illness:  Anne Casey is a 50 y.o. very pleasant female patient who presents with the following:  Pt recently dx with covid 19 - sx began on 1/1 She is here today with concern of possible superficial blood clot in her RIGHT leg  She is taking baby aspirin She first noticed this issue with her leg 4-5 days ago It is getting a little better The left leg is ok Overall she is feeling well, breathing normally No fever  She is going back to work tomorrow  Patient Active Problem List   Diagnosis Date Noted   Morbid obesity (HCC) 12/29/2019   High risk medication use 05/29/2019   History of Holter monitoring 09/2017   Palpitation    SVT (supraventricular tachycardia) (HCC) 08/09/2012    Past Medical History:  Diagnosis Date   High risk medication use 05/29/2019   History of Holter monitoring 09/2017   sinus tachycardia   Morbid obesity (HCC) 12/29/2019   Palpitation    SVT (supraventricular tachycardia) (HCC)     Past Surgical History:  Procedure Laterality Date   NO PAST SURGERIES      Social History   Tobacco Use   Smoking status: Never Smoker   Smokeless tobacco: Never Used  Vaping Use   Vaping Use: Never used  Substance Use Topics   Alcohol use: No   Drug use: No    Family History  Problem Relation Age of Onset   Hypertension Mother    Colon cancer Mother    Congestive Heart Failure Father    Heart Problems Sister     No Known Allergies  Medication list has been reviewed and updated.  Current Outpatient Medications on File Prior to Visit  Medication Sig Dispense Refill   aspirin EC 81 MG  tablet Take 1 tablet (81 mg total) by mouth daily. Swallow whole. Take for 2 weeks 14 tablet 0   EPINEPHrine (EPIPEN 2-PAK) 0.3 mg/0.3 mL IJ SOAJ injection Inject 0.3 mLs (0.3 mg total) into the muscle as needed for anaphylaxis. 1 each 1   flecainide (TAMBOCOR) 50 MG tablet TAKE 1 TABLET(50 MG) BY MOUTH TWICE DAILY 180 tablet 3   metoprolol tartrate (LOPRESSOR) 25 MG tablet TAKE 1.5 TABLET(25 MG) BY MOUTH TWICE DAILY 180 tablet 1   triamcinolone (KENALOG) 0.1 % Apply 1 application topically 2 (two) times daily. Use as needed for rash on chest 30 g 0   No current facility-administered medications on file prior to visit.    Review of Systems:  As per HPI- otherwise negative.   Physical Examination: Vitals:   10/25/20 1349  BP: 138/86  Pulse: 79  Resp: 18  SpO2: 96%   Vitals:   10/25/20 1349  Weight: 258 lb (117 kg)  Height: 5\' 7"  (1.702 m)   Body mass index is 40.41 kg/m. Ideal Body Weight: Weight in (lb) to have BMI = 25: 159.3  GEN: no acute distress.  Obese, generally looks well HEENT: Atraumatic, Normocephalic.  Ears and Nose: No external deformity. CV: RRR, No M/G/R. No JVD. No thrill. No extra heart sounds. PULM: CTA  B, no wheezes, crackles, rhonchi. No retractions. No resp. distress. No accessory muscle use. EXTR: No c/c/e PSYCH: Normally interactive. Conversant.  She has superficial varicosities on both lower extremities Right LE displays a an approximately 1.5 inch diameter area of erythema and tenderness.   Assessment and Plan: Localized swelling of right lower extremity - Plan: US Venous Img Lower Unilateral Right Patient today with right lower extremity swelling concerning for superficial thrombophlebitis We will obtain ultrasound today  This visit occurred during the SARS-CoV-2 public health emergency.  Safety protocols were in place, including screening questions prior to the visit, additional usage of staff PPE, and extensive cleaning of exam room while  observing appropriate contact time as indicated for disinfecting solutions.     Signed Abbe Amsterdam, MD  Received ultrasound as follows-called patient, she does have superficial thrombophlebitis.  No DVT.  This should resolve with anti-inflammatories, heat can also help.  Continue to walk/mobilize leg  Called in prescription for Voltaren 75 twice daily, take for 2 weeks Can stop aspirin  Asked her to let me know if not showing improvement within 7 to 10 days, sooner if getting worse US Venous Img Lower Unilateral Right  Result Date: 10/25/2020 CLINICAL DATA:  COVID positive, right lower extremity swelling EXAM: RIGHT LOWER EXTREMITY VENOUS DOPPLER ULTRASOUND TECHNIQUE: Gray-scale sonography with compression, as well as color and duplex ultrasound, were performed to evaluate the deep venous system(s) from the level of the common femoral vein through the popliteal and proximal calf veins. COMPARISON:  None. FINDINGS: VENOUS Normal compressibility of the common femoral, superficial femoral, and popliteal veins, as well as the visualized calf veins. Visualized portions of profunda femoral vein is unremarkable. No filling defects to suggest DVT on grayscale or color Doppler imaging. Doppler waveforms show normal direction of venous flow, normal respiratory plasticity and response to augmentation. Limited views of the contralateral common femoral vein are unremarkable. OTHER Evaluation of the great saphenous vein demonstrates no evidence of thrombus within the main trunk of the vein itself. However, in the upper calf there is a network of associated superficial venous varicosities, several of which are noncompressible consistent with thrombophlebitis. Limitations: None IMPRESSION: 1. No evidence of deep venous thrombosis. 2. Positive for superficial thrombophlebitis involving superficial venous varicosities in the mid calf. Additional superficial venous varicosities are also present but not thrombosed.  Electronically Signed   By: Malachy Moan M.D.   On: 10/25/2020 14:58

## 2020-10-25 ENCOUNTER — Ambulatory Visit (HOSPITAL_BASED_OUTPATIENT_CLINIC_OR_DEPARTMENT_OTHER)
Admission: RE | Admit: 2020-10-25 | Discharge: 2020-10-25 | Disposition: A | Discharge status: Home / Self Care | Payer: BC Managed Care – PPO | Source: Ambulatory Visit | Attending: Family Medicine | Admitting: Family Medicine

## 2020-10-25 ENCOUNTER — Ambulatory Visit (INDEPENDENT_AMBULATORY_CARE_PROVIDER_SITE_OTHER): Payer: BC Managed Care – PPO | Admitting: Family Medicine

## 2020-10-25 ENCOUNTER — Other Ambulatory Visit: Payer: Self-pay

## 2020-10-25 ENCOUNTER — Encounter: Payer: Self-pay | Admitting: Family Medicine

## 2020-10-25 VITALS — BP 138/86 | HR 79 | Resp 18 | Ht 67.0 in | Wt 258.0 lb

## 2020-10-25 DIAGNOSIS — I868 Varicose veins of other specified sites: Secondary | ICD-10-CM | POA: Diagnosis not present

## 2020-10-25 DIAGNOSIS — I8001 Phlebitis and thrombophlebitis of superficial vessels of right lower extremity: Secondary | ICD-10-CM | POA: Diagnosis not present

## 2020-10-25 DIAGNOSIS — R2241 Localized swelling, mass and lump, right lower limb: Secondary | ICD-10-CM | POA: Insufficient documentation

## 2020-10-25 DIAGNOSIS — M7989 Other specified soft tissue disorders: Secondary | ICD-10-CM | POA: Diagnosis not present

## 2020-10-25 DIAGNOSIS — I808 Phlebitis and thrombophlebitis of other sites: Secondary | ICD-10-CM | POA: Diagnosis not present

## 2020-10-25 MED ORDER — DICLOFENAC SODIUM 75 MG PO TBEC: 75.0000 mg | DELAYED_RELEASE_TABLET | Freq: Two times a day (BID) | ORAL | 0 refills | Status: DC

## 2020-10-25 NOTE — Patient Instructions (Signed)
Please go to the ground floor imaging dept now to have an ultrasound of your right leg,  You can then go home and I will be in touch with you!

## 2020-10-27 ENCOUNTER — Ambulatory Visit: Payer: BC Managed Care – PPO | Admitting: Cardiology

## 2020-11-08 ENCOUNTER — Encounter: Payer: Self-pay | Admitting: Cardiology

## 2020-11-08 ENCOUNTER — Ambulatory Visit: Payer: BC Managed Care – PPO | Admitting: Cardiology

## 2020-11-08 ENCOUNTER — Other Ambulatory Visit: Payer: Self-pay

## 2020-11-08 VITALS — BP 134/74 | HR 69 | Ht 67.0 in | Wt 262.1 lb

## 2020-11-08 DIAGNOSIS — E782 Mixed hyperlipidemia: Secondary | ICD-10-CM

## 2020-11-08 DIAGNOSIS — I471 Supraventricular tachycardia: Secondary | ICD-10-CM

## 2020-11-08 DIAGNOSIS — R002 Palpitations: Secondary | ICD-10-CM

## 2020-11-08 HISTORY — DX: Mixed hyperlipidemia: E78.2

## 2020-11-08 NOTE — Patient Instructions (Signed)

## 2020-11-08 NOTE — Progress Notes (Signed)
Cardiology Office Note:    Date:  11/08/2020   ID:  Anne Casey, DOB 04-26-1971, MRN [id number]  PCP:  Pearline Cables, MD  Cardiologist:  Garwin Brothers, MD   Referring MD: Pearline Cables, MD    ASSESSMENT:    1. SVT (supraventricular tachycardia) (HCC)   2. Morbid obesity (HCC)   3. Palpitation   4. Mixed dyslipidemia    PLAN:    In order of problems listed above:  1. SVT: Palpitations: I discussed my findings with the patient at extensive length.  I reviewed her records from the emergency room.  She has SVT recurring in spite of being on medications.  She cannot tolerate any increase in medications.  She has significant quality of life issues with medications.  Therefore I recommended her electrophysiology evaluation for possible ablation and she is agreeable and we will set this up.  Further recommendations will be made based on the findings of her colleagues 2. Morbid obesity: Diet was emphasized and weight reduction was stressed she promises to do better. 3. Mixed dyslipidemia: I discussed lipids from KPN sheet and diet was also emphasized. 4. Patient will be seen in follow-up appointment in 3 months or earlier if the patient has any concerns    Medication Adjustments/Labs and Tests Ordered: Current medicines are reviewed at length with the patient today.  Concerns regarding medicines are outlined above.  Orders Placed This Encounter  Procedures  . Ambulatory referral to Cardiac Electrophysiology  . EKG 12-Lead   No orders of the defined types were placed in this encounter.    No chief complaint on file.    History of Present Illness:    Anne Casey is a 50 y.o. female.  Patient has past medical history of supraventricular tachycardia morbid obesity and palpitations.  She recently had COVID-19 infection and is getting better from it.  She denies any chest pain orthopnea or PND.  She mentions today that when she exerts herself she has palpitations.   She went to the emergency room a few months ago with SVT and was treated and released.  She cannot tolerate the Toprol and flecainide and it affects her quality of life so she wants to know if there are any other options.  At this time she is taking these medications because she has no choice.  Past Medical History:  Diagnosis Date  . High risk medication use 05/29/2019  . History of Holter monitoring 09/2017   sinus tachycardia  . Morbid obesity (HCC) 12/29/2019  . Palpitation   . SVT (supraventricular tachycardia) (HCC)     Past Surgical History:  Procedure Laterality Date  . NO PAST SURGERIES      Current Medications: Current Meds  Medication Sig  . diclofenac (VOLTAREN) 75 MG EC tablet Take 1 tablet (75 mg total) by mouth 2 (two) times daily.  Marland Kitchen EPINEPHrine (EPIPEN 2-PAK) 0.3 mg/0.3 mL IJ SOAJ injection Inject 0.3 mLs (0.3 mg total) into the muscle as needed for anaphylaxis.  . flecainide (TAMBOCOR) 50 MG tablet TAKE 1 TABLET(50 MG) BY MOUTH TWICE DAILY  . metoprolol tartrate (LOPRESSOR) 25 MG tablet TAKE 1.5 TABLET(25 MG) BY MOUTH TWICE DAILY  . triamcinolone (KENALOG) 0.1 % Apply 1 application topically 2 (two) times daily. Use as needed for rash on chest     Allergies:   Patient has no known allergies.   Social History   Socioeconomic History  . Marital status: Single    Spouse name: Not on  file  . Number of children: Not on file  . Years of education: Not on file  . Highest education level: Not on file  Occupational History  . Not on file  Tobacco Use  . Smoking status: Never Smoker  . Smokeless tobacco: Never Used  Vaping Use  . Vaping Use: Never used  Substance and Sexual Activity  . Alcohol use: No  . Drug use: No  . Sexual activity: Not on file  Other Topics Concern  . Not on file  Social History Narrative  . Not on file   Social Determinants of Health   Financial Resource Strain: Not on file  Food Insecurity: Not on file  Transportation Needs: Not  on file  Physical Activity: Not on file  Stress: Not on file  Social Connections: Not on file     Family History: The patient's family history includes Colon cancer in her mother; Congestive Heart Failure in her father; Heart Problems in her sister; Hypertension in her mother.  ROS:   Please see the history of present illness.    All other systems reviewed and are negative.  EKGs/Labs/Other Studies Reviewed:    The following studies were reviewed today: Emergency room records were reviewed SVT was noted.  Currently EKG reveals sinus rhythm with nonspecific ST-T changes   Recent Labs: 07/09/2020: TSH 0.974 09/08/2020: ALT 20; BUN 13; Creatinine, Ser 0.75; Hemoglobin 13.4; Platelets 241; Potassium 3.6; Sodium 139  Recent Lipid Panel    Component Value Date/Time   CHOL 181 07/09/2020 0847   TRIG 198 (H) 07/09/2020 0847   HDL 38 (L) 07/09/2020 0847   CHOLHDL 4.8 (H) 07/09/2020 0847   CHOLHDL 4 04/30/2019 1341   VLDL 36.2 04/30/2019 1341   LDLCALC 108 (H) 07/09/2020 0847    Physical Exam:    VS:  BP 134/74   Pulse 69   Ht 5\' 7"  (1.702 m)   Wt 262 lb 1.6 oz (118.9 kg)   SpO2 97%   BMI 41.05 kg/m     Wt Readings from Last 3 Encounters:  11/08/20 262 lb 1.6 oz (118.9 kg)  10/25/20 258 lb (117 kg)  09/23/20 259 lb (117.5 kg)     GEN: Patient is in no acute distress HEENT: Normal NECK: No JVD; No carotid bruits LYMPHATICS: No lymphadenopathy CARDIAC: Hear sounds regular, 2/6 systolic murmur at the apex. RESPIRATORY:  Clear to auscultation without rales, wheezing or rhonchi  ABDOMEN: Soft, non-tender, non-distended MUSCULOSKELETAL:  No edema; No deformity  SKIN: Warm and dry NEUROLOGIC:  Alert and oriented x 3 PSYCHIATRIC:  Normal affect   Signed, 14/09/21, MD  11/08/2020 9:03 AM    [facility]

## 2020-11-09 ENCOUNTER — Encounter: Payer: Self-pay | Admitting: Cardiology

## 2020-11-09 ENCOUNTER — Ambulatory Visit (INDEPENDENT_AMBULATORY_CARE_PROVIDER_SITE_OTHER): Payer: BC Managed Care – PPO | Admitting: Cardiology

## 2020-11-09 VITALS — BP 138/70 | HR 71 | Ht 67.0 in | Wt 264.0 lb

## 2020-11-09 DIAGNOSIS — I471 Supraventricular tachycardia: Secondary | ICD-10-CM | POA: Diagnosis not present

## 2020-11-09 NOTE — Progress Notes (Signed)
Electrophysiology Office Note:    Date:  11/09/2020   ID:  Anne Casey, DOB 02-17-1971, MRN [id number]  PCP:  Pearline Cables, MD  [facility] Cardiologist:  No primary care provider on file.  [facility] Electrophysiologist:  None   Referring MD: Garwin Brothers, MD   Chief Complaint: SVT  History of Present Illness:    Anne Casey is a 50 y.o. female who presents for an evaluation of SVT at the request of Dr. Tomie China. Their medical history includes palpitations, obesity.  She was last seen by Dr. Tomie China on November 08, 2020.  At that appointment she reported going to the emergency department several months ago with an episode of SVT.  She is tried to take metoprolol and flecainide but does not tolerate these medications very well.  She was seen in the emergency department on September 08, 2020 with an episode of palpitations and chest tightness.  Her heart rate at the time was 185 bpm she was given a dose of adenosine which converted her back to normal rhythm.  She is with her niece today.  They tell me that she has been to the emergency department around 10 times for the same rhythm.  Each time it is successfully treated with adenosine except for one occasion which it was treated with cardioversion.  The cardioversion was performed because her blood pressure was low during the tachycardia.     Past Medical History:  Diagnosis Date  . High risk medication use 05/29/2019  . History of Holter monitoring 09/2017   sinus tachycardia  . Morbid obesity (HCC) 12/29/2019  . Palpitation   . SVT (supraventricular tachycardia) (HCC)     Past Surgical History:  Procedure Laterality Date  . NO PAST SURGERIES      Current Medications: Current Meds  Medication Sig  . EPINEPHrine (EPIPEN 2-PAK) 0.3 mg/0.3 mL IJ SOAJ injection Inject 0.3 mLs (0.3 mg total) into the muscle as needed for anaphylaxis.  . flecainide (TAMBOCOR) 50 MG tablet TAKE 1 TABLET(50 MG) BY MOUTH TWICE  DAILY  . metoprolol tartrate (LOPRESSOR) 25 MG tablet TAKE 1.5 TABLET(25 MG) BY MOUTH TWICE DAILY  . triamcinolone (KENALOG) 0.1 % Apply 1 application topically 2 (two) times daily. Use as needed for rash on chest     Allergies:   Patient has no known allergies.   Social History   Socioeconomic History  . Marital status: Single    Spouse name: Not on file  . Number of children: Not on file  . Years of education: Not on file  . Highest education level: Not on file  Occupational History  . Not on file  Tobacco Use  . Smoking status: Never Smoker  . Smokeless tobacco: Never Used  Vaping Use  . Vaping Use: Never used  Substance and Sexual Activity  . Alcohol use: No  . Drug use: No  . Sexual activity: Not on file  Other Topics Concern  . Not on file  Social History Narrative  . Not on file   Social Determinants of Health   Financial Resource Strain: Not on file  Food Insecurity: Not on file  Transportation Needs: Not on file  Physical Activity: Not on file  Stress: Not on file  Social Connections: Not on file     Family History: The patient's family history includes Colon cancer in her mother; Congestive Heart Failure in her father; Heart Problems in her sister; Hypertension in her mother.  ROS:   Please  see the history of present illness.    All other systems reviewed and are negative.  EKGs/Labs/Other Studies Reviewed:    The following studies were reviewed today:  09/08/2020 ECG Narrow complex tachycardia    09/08/2020 ECG post adenosine   May 14, 2019 echo personally reviewed Left ventricular function normal, 60% Right ventricular function normal Mildly dilated left atrium No significant valvular abnormalities  Recent Labs: 07/09/2020: TSH 0.974 09/08/2020: ALT 20; BUN 13; Creatinine, Ser 0.75; Hemoglobin 13.4; Platelets 241; Potassium 3.6; Sodium 139  Recent Lipid Panel    Component Value Date/Time   CHOL 181 07/09/2020 0847   TRIG 198 (H)  07/09/2020 0847   HDL 38 (L) 07/09/2020 0847   CHOLHDL 4.8 (H) 07/09/2020 0847   CHOLHDL 4 04/30/2019 1341   VLDL 36.2 04/30/2019 1341   LDLCALC 108 (H) 07/09/2020 0847    Physical Exam:    VS:  BP 138/70   Pulse 71   Ht 5\' 7"  (1.702 m)   Wt 264 lb (119.7 kg)   SpO2 97%   BMI 41.35 kg/m     Wt Readings from Last 3 Encounters:  11/09/20 264 lb (119.7 kg)  11/08/20 262 lb 1.6 oz (118.9 kg)  10/25/20 258 lb (117 kg)     GEN:  Well nourished, well developed in no acute distress.  Obese HEENT: Normal NECK: No JVD; No carotid bruits LYMPHATICS: No lymphadenopathy CARDIAC: RRR, no murmurs, rubs, gallops RESPIRATORY:  Clear to auscultation without rales, wheezing or rhonchi  ABDOMEN: Soft, non-tender, non-distended MUSCULOSKELETAL:  No edema; No deformity  SKIN: Warm and dry NEUROLOGIC:  Alert and oriented x 3 PSYCHIATRIC:  Normal affect   ASSESSMENT:    1. SVT (supraventricular tachycardia) (HCC)   2. Morbid obesity (HCC)    PLAN:    In order of problems listed above:  1. SVT Recurrent episodes despite therapy with metoprolol and flecainide.  Highly symptomatic requiring presentation the emergency department for treatment with adenosine.  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  I discussed the treatment options for her SVT including an alternative antiarrhythmic versus EP study and ablation.  I discussed the risks, expected recovery time and expected efficacy rates and they would like to proceed with scheduling an ablation.  We will hold her beta-blocker and flecainide 5 days prior to the procedure.  Therapeutic strategies for supraventricular tachycardia including medicine and ablation were discussed in detail with the patient today. Risk, benefits, and alternatives to EP study and radiofrequency ablation were also discussed in detail today. These risks include but are not limited to stroke, bleeding, vascular  damage, tamponade, perforation, damage to the heart and other structures, AV block requiring pacemaker, worsening renal function, and death. The patient understands these risk and wishes to proceed.  We will therefore proceed with catheter ablation at the next available time.  2.  Obesity    Medication Adjustments/Labs and Tests Ordered: Current medicines are reviewed at length with the patient today.  Concerns regarding medicines are outlined above.  No orders of the defined types were placed in this encounter.  No orders of the defined types were placed in this encounter.    Signed, 12/23/20, MD, [facility]  11/09/2020 2:14 PM    Electrophysiology [facility]

## 2020-11-09 NOTE — Patient Instructions (Addendum)
Medication Instructions:  Your physician recommends that you continue on your current medications as directed. Please refer to the Current Medication list given to you today.  Labwork: None ordered.  Testing/Procedures: None ordered.  Follow-Up:  SEE INSTRUCTION LETTER  Any Other Special Instructions Will Be Listed Below (If Applicable).  If you need a refill on your cardiac medications before your next appointment, please call your pharmacy.     Cardiac electrophysiology: From cell to bedside (7th ed., pp. 1239-1252). [place], [place]: Elsevier.">  Cardiac Ablation Cardiac ablation is a procedure to destroy, or ablate, a small amount of heart tissue in very specific places. The heart has many electrical connections. Sometimes these connections are abnormal and can cause the heart to beat very fast or irregularly. Ablating some of the areas that cause problems can improve the heart's rhythm or return it to normal. Ablation may be done for people who:  Have Wolff-Parkinson-White syndrome.  Have fast heart rhythms (tachycardia).  Have taken medicines for an abnormal heart rhythm (arrhythmia) that were not effective or caused side effects.  Have a high-risk heartbeat that may be life-threatening. During the procedure, a small incision is made in the neck or the groin, and a long, thin tube (catheter) is inserted into the incision and moved to the heart. Small devices (electrodes) on the tip of the catheter will send out electrical currents. A type of X-ray (fluoroscopy) will be used to help guide the catheter and to provide images of the heart. Tell a health care provider about:  Any allergies you have.  All medicines you are taking, including vitamins, herbs, eye drops, creams, and over-the-counter medicines.  Any problems you or family members have had with anesthetic medicines.  Any blood disorders you have.  Any surgeries you have had.  Any medical conditions you have,  such as kidney failure.  Whether you are pregnant or may be pregnant. What are the risks? Generally, this is a safe procedure. However, problems may occur, including:  Infection.  Bruising and bleeding at the catheter insertion site.  Bleeding into the chest, especially into the sac that surrounds the heart. This is a serious complication.  Stroke or blood clots.  Damage to nearby structures or organs.  Allergic reaction to medicines or dyes.  Need for a permanent pacemaker if the normal electrical system is damaged. A pacemaker is a small computer that sends electrical signals to the heart and helps your heart beat normally.  The procedure not being fully effective. This may not be recognized until months later. Repeat ablation procedures are sometimes done. What happens before the procedure? Medicines Ask your health care provider about:  Changing or stopping your regular medicines. This is especially important if you are taking diabetes medicines or blood thinners.  Taking medicines such as aspirin and ibuprofen. These medicines can thin your blood. Do not take these medicines unless your health care provider tells you to take them.  Taking over-the-counter medicines, vitamins, herbs, and supplements. General instructions  Follow instructions from your health care provider about eating or drinking restrictions.  Plan to have someone take you home from the hospital or clinic.  If you will be going home right after the procedure, plan to have someone with you for 24 hours.  Ask your health care provider what steps will be taken to prevent infection. What happens during the procedure?  An IV will be inserted into one of your veins.  You will be given a medicine to help you relax (  sedative).  The skin on your neck or groin will be numbed.  An incision will be made in your neck or your groin.  A needle will be inserted through the incision and into a large vein in your  neck or groin.  A catheter will be inserted into the needle and moved to your heart.  Dye may be injected through the catheter to help your surgeon see the area of the heart that needs treatment.  Electrical currents will be sent from the catheter to ablate heart tissue in desired areas. There are three types of energy that may be used to do this: ? Heat (radiofrequency energy). ? Laser energy. ? Extreme cold (cryoablation).  When the tissue has been ablated, the catheter will be removed.  Pressure will be held on the insertion area to prevent a lot of bleeding.  A bandage (dressing) will be placed over the insertion area. The exact procedure may vary among health care providers and hospitals.   What happens after the procedure?  Your blood pressure, heart rate, breathing rate, and blood oxygen level will be monitored until you leave the hospital or clinic.  Your insertion area will be monitored for bleeding. You will need to lie still for a few hours to ensure that you do not bleed from the insertion area.  Do not drive for 24 hours or as long as told by your health care provider. Summary  Cardiac ablation is a procedure to destroy, or ablate, a small amount of heart tissue using an electrical current. This procedure can improve the heart rhythm or return it to normal.  Tell your health care provider about any medical conditions you may have and all medicines you are taking to treat them.  This is a safe procedure, but problems may occur. Problems may include infection, bruising, damage to nearby organs or structures, or allergic reactions to medicines.  Follow your health care provider's instructions about eating and drinking before the procedure. You may also be told to change or stop some of your medicines.  After the procedure, do not drive for 24 hours or as long as told by your health care provider. This information is not intended to replace advice given to you by your  health care provider. Make sure you discuss any questions you have with your health care provider. Document Revised: 08/11/2019 Document Reviewed: 08/11/2019 Elsevier Patient Education  2021 Elsevier Inc.      

## 2020-11-18 ENCOUNTER — Emergency Department (HOSPITAL_COMMUNITY): Payer: BC Managed Care – PPO

## 2020-11-18 ENCOUNTER — Emergency Department (HOSPITAL_COMMUNITY)
Admission: EM | Admit: 2020-11-18 | Discharge: 2020-11-18 | Disposition: A | Discharge status: Home / Self Care | Payer: BC Managed Care – PPO | Attending: Emergency Medicine | Admitting: Emergency Medicine

## 2020-11-18 DIAGNOSIS — R0902 Hypoxemia: Secondary | ICD-10-CM | POA: Diagnosis not present

## 2020-11-18 DIAGNOSIS — I471 Supraventricular tachycardia: Secondary | ICD-10-CM | POA: Insufficient documentation

## 2020-11-18 DIAGNOSIS — R0789 Other chest pain: Secondary | ICD-10-CM | POA: Diagnosis not present

## 2020-11-18 DIAGNOSIS — R000 Tachycardia, unspecified: Secondary | ICD-10-CM | POA: Diagnosis not present

## 2020-11-18 DIAGNOSIS — R079 Chest pain, unspecified: Secondary | ICD-10-CM | POA: Diagnosis not present

## 2020-11-18 LAB — CBC
HCT: 40.9 % (ref 36.0–46.0)
Hemoglobin: 13.3 g/dL (ref 12.0–15.0)
MCH: 28.8 pg (ref 26.0–34.0)
MCHC: 32.5 g/dL (ref 30.0–36.0)
MCV: 88.5 fL (ref 80.0–100.0)
Platelets: 199 10*3/uL (ref 150–400)
RBC: 4.62 MIL/uL (ref 3.87–5.11)
RDW: 13.1 % (ref 11.5–15.5)
WBC: 10.6 10*3/uL — ABNORMAL HIGH (ref 4.0–10.5)
nRBC: 0 % (ref 0.0–0.2)

## 2020-11-18 LAB — D-DIMER, QUANTITATIVE: D-Dimer, Quant: 1.7 ug/mL-FEU — ABNORMAL HIGH (ref 0.00–0.50)

## 2020-11-18 LAB — BASIC METABOLIC PANEL
Anion gap: 9 (ref 5–15)
BUN: 10 mg/dL (ref 6–20)
CO2: 22 mmol/L (ref 22–32)
Calcium: 9 mg/dL (ref 8.9–10.3)
Chloride: 107 mmol/L (ref 98–111)
Creatinine, Ser: 0.63 mg/dL (ref 0.44–1.00)
GFR, Estimated: >60 mL/min (ref >60)
Glucose, Bld: 118 mg/dL — ABNORMAL HIGH (ref 70–99)
Potassium: 3.7 mmol/L (ref 3.5–5.1)
Sodium: 138 mmol/L (ref 135–145)

## 2020-11-18 LAB — TSH: TSH: 2.181 u[IU]/mL (ref 0.350–4.500)

## 2020-11-18 LAB — TROPONIN I (HIGH SENSITIVITY): Troponin I (High Sensitivity): 15 ng/L (ref <18)

## 2020-11-18 LAB — MAGNESIUM: Magnesium: 1.9 mg/dL (ref 1.7–2.4)

## 2020-11-18 MED ORDER — METOPROLOL TARTRATE 5 MG/5ML IV SOLN
5.0000 mg | Freq: Once | INTRAVENOUS | Status: AC
  Administered 2020-11-18: 5 mg via INTRAVENOUS
  Filled 2020-11-18: qty 5

## 2020-11-18 MED ORDER — IOHEXOL 350 MG/ML SOLN
75.0000 mL | Freq: Once | INTRAVENOUS | Status: AC | PRN
  Administered 2020-11-18: 75 mL via INTRAVENOUS

## 2020-11-18 NOTE — ED Provider Notes (Signed)
[facility] EMERGENCY DEPARTMENT Provider Note   CSN: [id number] Arrival date & time: 11/18/20  1743     History No chief complaint on file.   Anne Casey is a 50 y.o. female.  Pt presents to the ED today with chest pain and palpitations.  She said it started around 1500 today.  She waited to see if it would go away and it did not, so she called EMS.  She was found to be in SVT with HR in the 180s.  The pt was given 6 mg of adenosine by EMS which converted her to sinus tachy.  Pt has had SVT in the past and is followed by Dr. Tomie China.  She is on metoprolol and flecanide.  She said she's been compliant with these meds.  The pt is scheduled for an ablation on 3/7.  Pt said she feels fine now that her heart is back in normal rhythm.  Pt is not vaccinated against Covid and had it about 1 month ago.  Those sx have resolved.        Past Medical History:  Diagnosis Date  . High risk medication use 05/29/2019  . History of Holter monitoring 09/2017   sinus tachycardia  . Morbid obesity (HCC) 12/29/2019  . Palpitation   . SVT (supraventricular tachycardia) [facility])     Patient Active Problem List   Diagnosis Date Noted  . Mixed dyslipidemia 11/08/2020  . Morbid obesity (HCC) 12/29/2019  . High risk medication use 05/29/2019  . History of Holter monitoring 09/2017  . Palpitation   . SVT (supraventricular tachycardia) (HCC) 08/09/2012    Past Surgical History:  Procedure Laterality Date  . NO PAST SURGERIES       OB History   No obstetric history on file.     Family History  Problem Relation Age of Onset  . Hypertension Mother   . Colon cancer Mother   . Congestive Heart Failure Father   . Heart Problems Sister     Social History   Tobacco Use  . Smoking status: Never Smoker  . Smokeless tobacco: Never Used  Vaping Use  . Vaping Use: Never used  Substance Use Topics  . Alcohol use: No  . Drug use: No    Home Medications Prior to Admission  medications   Medication Sig Start Date End Date Taking? Authorizing Provider  EPINEPHrine (EPIPEN 2-PAK) 0.3 mg/0.3 mL IJ SOAJ injection Inject 0.3 mLs (0.3 mg total) into the muscle as needed for anaphylaxis. 03/29/20   Joni Reining, PA-C  flecainide (TAMBOCOR) 50 MG tablet TAKE 1 TABLET(50 MG) BY MOUTH TWICE DAILY 07/19/20   Revankar, Aundra Dubin, MD  metoprolol tartrate (LOPRESSOR) 25 MG tablet TAKE 1.5 TABLET(25 MG) BY MOUTH TWICE DAILY 09/17/20   Copland, Gwenlyn Found, MD  triamcinolone (KENALOG) 0.1 % Apply 1 application topically 2 (two) times daily. Use as needed for rash on chest 10/21/20   Copland, Gwenlyn Found, MD    Allergies    Patient has no known allergies.  Review of Systems   Review of Systems  Cardiovascular: Positive for chest pain and palpitations.  All other systems reviewed and are negative.   Physical Exam Updated Vital Signs BP 136/82   Pulse 71   Temp 98 F (36.7 C) (Oral)   Resp (!) 25   SpO2 98%   Physical Exam Vitals and nursing note reviewed.  Constitutional:      Appearance: Normal appearance.  HENT:  Head: Normocephalic and atraumatic.     Right Ear: External ear normal.     Left Ear: External ear normal.     Nose: Nose normal.     Mouth/Throat:     Mouth: Mucous membranes are moist.     Pharynx: Oropharynx is clear.  Eyes:     Extraocular Movements: Extraocular movements intact.     Conjunctiva/sclera: Conjunctivae normal.     Pupils: Pupils are equal, round, and reactive to light.  Cardiovascular:     Rate and Rhythm: Regular rhythm. Tachycardia present.     Pulses: Normal pulses.     Heart sounds: Normal heart sounds.  Pulmonary:     Effort: Pulmonary effort is normal.     Breath sounds: Normal breath sounds.  Abdominal:     General: Abdomen is flat. Bowel sounds are normal.     Palpations: Abdomen is soft.  Musculoskeletal:        General: Normal range of motion.     Cervical back: Normal range of motion and neck supple.  Skin:     General: Skin is warm.     Capillary Refill: Capillary refill takes less than 2 seconds.  Neurological:     General: No focal deficit present.     Mental Status: She is alert and oriented to person, place, and time.  Psychiatric:        Mood and Affect: Mood normal.        Behavior: Behavior normal.        Thought Content: Thought content normal.        Judgment: Judgment normal.     ED Results / Procedures / Treatments   Labs (all labs ordered are listed, but only abnormal results are displayed) Labs Reviewed  BASIC METABOLIC PANEL - Abnormal; Notable for the following components:      Result Value   Glucose, Bld 118 (*)    All other components within normal limits  CBC - Abnormal; Notable for the following components:   WBC 10.6 (*)    All other components within normal limits  D-DIMER, QUANTITATIVE (NOT AT [facility]) - Abnormal; Notable for the following components:   D-Dimer, Quant 1.70 (*)    All other components within normal limits  MAGNESIUM  TSH  TROPONIN I (HIGH SENSITIVITY)  TROPONIN I (HIGH SENSITIVITY)    EKG EKG Interpretation  Date/Time:  Thursday November 18 2020 17:55:11 EST Ventricular Rate:  98 PR Interval:    QRS Duration: 69 QT Interval:  343 QTC Calculation: 438 R Axis:   66 Text Interpretation: Sinus rhythm Right atrial enlargement No significant change since last tracing Confirmed by Jacalyn Lefevre [id number]) on 11/18/2020 6:31:00 PM    EKG with conversion to ST after 6 mg Adenosine IV.  Radiology CT Angio Chest PE W and/or Wo Contrast  Result Date: 11/18/2020 CLINICAL DATA:  Chest tightness and tachycardia EXAM: CT ANGIOGRAPHY CHEST WITH CONTRAST TECHNIQUE: Multidetector CT imaging of the chest was performed using the standard protocol during bolus administration of intravenous contrast. Multiplanar CT image reconstructions and MIPs were obtained to evaluate the vascular anatomy. CONTRAST:  56mL OMNIPAQUE IOHEXOL 350 MG/ML SOLN COMPARISON:  Film from  earlier in the same day, CT from 01/16/2019 FINDINGS: Cardiovascular: Thoracic aorta shows a normal branching pattern. No significant cardiomegaly is noted. No significant coronary calcifications are noted. The pulmonary artery shows a normal branching pattern. No definitive filling defects to suggest pulmonary emboli are noted. Mediastinum/Nodes: Thoracic inlet is within normal limits. No  hilar or mediastinal adenopathy is noted. The esophagus is within normal limits. Lungs/Pleura: Mild dependent atelectatic changes are noted. No focal infiltrate or sizable effusion is noted. No sizable parenchymal nodules are seen. Upper Abdomen: Visualized upper abdomen shows fatty infiltration of the liver. Musculoskeletal: Degenerative changes of the thoracic spine are noted. Review of the MIP images confirms the above findings. IMPRESSION: No evidence of pulmonary emboli. Mild dependent atelectatic changes are noted. No acute abnormality seen. Electronically Signed   By: Alcide Clever M.D.   On: 11/18/2020 19:35   DG Chest Port 1 View  Result Date: 11/18/2020 CLINICAL DATA:  Chest tightness. EXAM: PORTABLE CHEST 1 VIEW COMPARISON:  01/15/2019 FINDINGS: The heart size and mediastinal contours are within normal limits. There are linear airspace opacities in the lung bases bilaterally, stable from prior study and favored to represent areas of chronic atelectasis or scarring. The visualized skeletal structures are unremarkable. IMPRESSION: No active disease. Electronically Signed   By: Katherine Mantle M.D.   On: 11/18/2020 18:20    Procedures Procedures   Medications Ordered in ED Medications  metoprolol tartrate (LOPRESSOR) injection 5 mg (5 mg Intravenous Given 11/18/20 1813)  iohexol (OMNIPAQUE) 350 MG/ML injection 75 mL (75 mLs Intravenous Contrast Given 11/18/20 1914)    ED Course  I have reviewed the triage vital signs and the nursing notes.  Pertinent labs & imaging results that were available during my care  of the patient were reviewed by me and considered in my medical decision making (see chart for details).    MDM Rules/Calculators/A&P                          Pt's HR in the 70s.  She is feeling good.  Labs are good except for an elevation in ddimer.  As she had covid last month, I did a CTA which showed no PE or any other acute abnormality.  She was d/w [facility] on call Fellow who recommended no medication adjustments.  She is to f/u with Dr. Tomie China and with her ablation as scheduled.  Pt is stable for d/c.  Return if worse.  Final Clinical Impression(s) / ED Diagnoses Final diagnoses:  SVT (supraventricular tachycardia) (HCC)    Rx / DC Orders ED Discharge Orders    None       Jacalyn Lefevre, MD 11/18/20 2138

## 2020-11-18 NOTE — ED Triage Notes (Signed)
Pt arrived to ED via ems from home. Pt began with chest tightness at 1500 today, waited an hour and called ems. Pt was found in SVT with HR 180's. Was given 6 mg of adenosine and converted to NRS. Pt is without any symptoms or complaints at this time.

## 2020-11-18 NOTE — ED Notes (Signed)
Patient verbalized understanding of discharge instructions. Opportunity for questions and answers.  

## 2020-11-19 NOTE — Telephone Encounter (Signed)
Spoke w/ pt who is asking for a letter for work stating that she will be out of work from 3/2 - 3/14. She states that Dr. Lalla Brothers is going to hold a medication 5 days prior to planned ablation scheduled for 3/7 -- they have already discussed her being out for 12 days due to this and recovery. Aware I will discuss w/ Dr. Lalla Brothers next week, but if not then Boneta Lucks will address when she returns to office week after next.  She also is asking what she should do if she experiences issues during the time period she will be holding medication in preparation for procedure.  (she is concerned b/c she had to go to ED yesterday for more SVT).  Aware we will also get advisement on this matter and let her know. Pt is agreeable to plan.

## 2020-12-02 ENCOUNTER — Encounter: Payer: Self-pay | Admitting: *Deleted

## 2020-12-02 NOTE — Telephone Encounter (Signed)
Pt aware requested letter is being left at front desk, today, for her to pick up at her convenience. Also gave Dr. Lovena Neighbours advisement of what to do if she experiences HR/SVT issues while holding medication prior to procedure. Patient verbalized understanding and agreeable to plan.

## 2020-12-17 ENCOUNTER — Other Ambulatory Visit: Payer: BC Managed Care – PPO | Admitting: *Deleted

## 2020-12-17 ENCOUNTER — Other Ambulatory Visit: Payer: Self-pay

## 2020-12-17 DIAGNOSIS — I471 Supraventricular tachycardia: Secondary | ICD-10-CM

## 2020-12-17 LAB — CBC WITH DIFFERENTIAL/PLATELET
Basophils Absolute: 0 10*3/uL (ref 0.0–0.2)
Basos: 1 %
EOS (ABSOLUTE): 0.1 10*3/uL (ref 0.0–0.4)
Eos: 2 %
Hematocrit: 42.3 % (ref 34.0–46.6)
Hemoglobin: 13.8 g/dL (ref 11.1–15.9)
Immature Grans (Abs): 0 10*3/uL (ref 0.0–0.1)
Immature Granulocytes: 0 %
Lymphocytes Absolute: 2.3 10*3/uL (ref 0.7–3.1)
Lymphs: 33 %
MCH: 28.3 pg (ref 26.6–33.0)
MCHC: 32.6 g/dL (ref 31.5–35.7)
MCV: 87 fL (ref 79–97)
Monocytes Absolute: 0.5 10*3/uL (ref 0.1–0.9)
Monocytes: 8 %
Neutrophils Absolute: 3.8 10*3/uL (ref 1.4–7.0)
Neutrophils: 56 %
Platelets: 233 10*3/uL (ref 150–450)
RBC: 4.87 x10E6/uL (ref 3.77–5.28)
RDW: 13 % (ref 11.7–15.4)
WBC: 6.8 10*3/uL (ref 3.4–10.8)

## 2020-12-17 LAB — BASIC METABOLIC PANEL
BUN/Creatinine Ratio: 15 (ref 9–23)
BUN: 10 mg/dL (ref 6–24)
CO2: 19 mmol/L — ABNORMAL LOW (ref 20–29)
Calcium: 9.4 mg/dL (ref 8.7–10.2)
Chloride: 104 mmol/L (ref 96–106)
Creatinine, Ser: 0.67 mg/dL (ref 0.57–1.00)
Glucose: 120 mg/dL — ABNORMAL HIGH (ref 65–99)
Potassium: 3.8 mmol/L (ref 3.5–5.2)
Sodium: 139 mmol/L (ref 134–144)
eGFR: 106 mL/min/{1.73_m2} (ref >59)

## 2020-12-17 NOTE — Pre-Procedure Instructions (Signed)
Instructed patient on the following items: Arrival time 0830 Nothing to eat or drink after midnight No meds AM of procedure Responsible person to drive you home and stay with you for 24 hrs

## 2020-12-19 NOTE — Anesthesia Preprocedure Evaluation (Addendum)
Anesthesia Evaluation  Patient identified by MRN, date of birth, ID band Patient awake    Reviewed: Allergy & Precautions, NPO status , Patient's Chart, lab work & pertinent test results  History of Anesthesia Complications Negative for: history of anesthetic complications  Airway Mallampati: II  TM Distance: >3 FB Neck ROM: Full    Dental  (+) Missing,    Pulmonary neg pulmonary ROS,    Pulmonary exam normal        Cardiovascular Normal cardiovascular exam+ dysrhythmias (on flecainide and metoprolol) Supra Ventricular Tachycardia   TTE 2020: EF 60-65%, valves ok   Neuro/Psych negative neurological ROS  negative psych ROS   GI/Hepatic negative GI ROS, Neg liver ROS,   Endo/Other  Morbid obesity  Renal/GU negative Renal ROS  negative genitourinary   Musculoskeletal negative musculoskeletal ROS (+)   Abdominal   Peds  Hematology negative hematology ROS (+)   Anesthesia Other Findings Day of surgery medications reviewed with patient.  Reproductive/Obstetrics negative OB ROS                            Anesthesia Physical Anesthesia Plan  ASA: III  Anesthesia Plan: MAC   Post-op Pain Management:    Induction:   PONV Risk Score and Plan: 2 and Treatment may vary due to age or medical condition, Propofol infusion and Midazolam  Airway Management Planned: Natural Airway and Simple Face Mask  Additional Equipment:   Intra-op Plan:   Post-operative Plan:   Informed Consent: I have reviewed the patients History and Physical, chart, labs and discussed the procedure including the risks, benefits and alternatives for the proposed anesthesia with the patient or authorized representative who has indicated his/her understanding and acceptance.     Dental advisory given  Plan Discussed with: CRNA  Anesthesia Plan Comments:        Anesthesia Quick Evaluation

## 2020-12-20 ENCOUNTER — Other Ambulatory Visit: Payer: Self-pay

## 2020-12-20 ENCOUNTER — Encounter (HOSPITAL_COMMUNITY)
Admission: RE | Disposition: A | Discharge status: Home / Self Care | Payer: Self-pay | Source: Home / Self Care | Attending: Cardiology

## 2020-12-20 ENCOUNTER — Ambulatory Visit (HOSPITAL_COMMUNITY): Payer: BC Managed Care – PPO | Admitting: Anesthesiology

## 2020-12-20 ENCOUNTER — Ambulatory Visit (HOSPITAL_COMMUNITY)
Admission: RE | Admit: 2020-12-20 | Discharge: 2020-12-20 | Disposition: A | Discharge status: Home / Self Care | Payer: BC Managed Care – PPO | Attending: Cardiology | Admitting: Cardiology

## 2020-12-20 DIAGNOSIS — R002 Palpitations: Secondary | ICD-10-CM | POA: Diagnosis not present

## 2020-12-20 DIAGNOSIS — Z6841 Body Mass Index (BMI) 40.0 and over, adult: Secondary | ICD-10-CM | POA: Diagnosis not present

## 2020-12-20 DIAGNOSIS — Z79899 Other long term (current) drug therapy: Secondary | ICD-10-CM | POA: Diagnosis not present

## 2020-12-20 DIAGNOSIS — E782 Mixed hyperlipidemia: Secondary | ICD-10-CM | POA: Diagnosis not present

## 2020-12-20 DIAGNOSIS — I471 Supraventricular tachycardia: Secondary | ICD-10-CM | POA: Diagnosis not present

## 2020-12-20 DIAGNOSIS — Z8249 Family history of ischemic heart disease and other diseases of the circulatory system: Secondary | ICD-10-CM | POA: Insufficient documentation

## 2020-12-20 HISTORY — PX: SVT ABLATION: EP1225

## 2020-12-20 LAB — PREGNANCY, URINE: Preg Test, Ur: NEGATIVE

## 2020-12-20 SURGERY — SVT ABLATION
Anesthesia: Monitor Anesthesia Care

## 2020-12-20 MED ORDER — ISOPROTERENOL HCL 0.2 MG/ML IJ SOLN
INTRAVENOUS | Status: DC | PRN
  Administered 2020-12-20: 2 ug/min via INTRAVENOUS

## 2020-12-20 MED ORDER — ACETAMINOPHEN 500 MG PO TABS
1000.0000 mg | ORAL_TABLET | Freq: Once | ORAL | Status: AC
  Filled 2020-12-20: qty 2

## 2020-12-20 MED ORDER — PROPOFOL 500 MG/50ML IV EMUL
INTRAVENOUS | Status: DC | PRN
  Administered 2020-12-20: 50 ug/kg/min via INTRAVENOUS

## 2020-12-20 MED ORDER — SODIUM CHLORIDE 0.9 % IV SOLN: 250.0000 mL | INTRAVENOUS | Status: DC | PRN

## 2020-12-20 MED ORDER — ISOPROTERENOL HCL 0.2 MG/ML IJ SOLN
INTRAMUSCULAR | Status: AC
  Filled 2020-12-20: qty 5

## 2020-12-20 MED ORDER — ONDANSETRON HCL 4 MG/2ML IJ SOLN
INTRAMUSCULAR | Status: DC | PRN
  Administered 2020-12-20: 4 mg via INTRAVENOUS

## 2020-12-20 MED ORDER — FENTANYL CITRATE (PF) 100 MCG/2ML IJ SOLN
INTRAMUSCULAR | Status: DC | PRN
  Administered 2020-12-20: 50 ug via INTRAVENOUS
  Administered 2020-12-20 (×2): 25 ug via INTRAVENOUS

## 2020-12-20 MED ORDER — HEPARIN SODIUM (PORCINE) 1000 UNIT/ML IJ SOLN
INTRAMUSCULAR | Status: DC | PRN
  Administered 2020-12-20: 1000 [IU] via INTRAVENOUS

## 2020-12-20 MED ORDER — LIDOCAINE HCL (CARDIAC) PF 100 MG/5ML IV SOSY
PREFILLED_SYRINGE | INTRAVENOUS | Status: DC | PRN
  Administered 2020-12-20: 20 mg via INTRAVENOUS

## 2020-12-20 MED ORDER — HEPARIN SODIUM (PORCINE) 1000 UNIT/ML IJ SOLN
INTRAMUSCULAR | Status: AC
  Filled 2020-12-20: qty 1

## 2020-12-20 MED ORDER — MIDAZOLAM HCL 5 MG/5ML IJ SOLN
INTRAMUSCULAR | Status: DC | PRN
  Administered 2020-12-20 (×2): 1 mg via INTRAVENOUS

## 2020-12-20 MED ORDER — BUPIVACAINE HCL (PF) 0.25 % IJ SOLN
INTRAMUSCULAR | Status: AC
  Filled 2020-12-20: qty 30

## 2020-12-20 MED ORDER — SODIUM CHLORIDE 0.9% FLUSH: 3.0000 mL | Freq: Two times a day (BID) | INTRAVENOUS | Status: DC

## 2020-12-20 MED ORDER — BUPIVACAINE HCL (PF) 0.25 % IJ SOLN
INTRAMUSCULAR | Status: DC | PRN
  Administered 2020-12-20: 30 mL

## 2020-12-20 MED ORDER — ONDANSETRON HCL 4 MG/2ML IJ SOLN: 4.0000 mg | Freq: Four times a day (QID) | INTRAMUSCULAR | Status: DC | PRN

## 2020-12-20 MED ORDER — SODIUM CHLORIDE 0.9% FLUSH: 3.0000 mL | INTRAVENOUS | Status: DC | PRN

## 2020-12-20 MED ORDER — SODIUM CHLORIDE 0.9 % IV SOLN: INTRAVENOUS | Status: DC

## 2020-12-20 MED ORDER — ACETAMINOPHEN 325 MG PO TABS
650.0000 mg | ORAL_TABLET | ORAL | Status: DC | PRN
  Filled 2020-12-20: qty 2

## 2020-12-20 MED ORDER — ACETAMINOPHEN 500 MG PO TABS
ORAL_TABLET | ORAL | Status: AC
  Administered 2020-12-20: 1000 mg via ORAL
  Filled 2020-12-20: qty 2

## 2020-12-20 MED ORDER — HEPARIN (PORCINE) IN NACL 1000-0.9 UT/500ML-% IV SOLN
INTRAVENOUS | Status: DC | PRN
  Administered 2020-12-20 (×2): 500 mL

## 2020-12-20 SURGICAL SUPPLY — 14 items
CATH CRD2 QUAD 6FR 5 (CATHETERS) ×1 IMPLANT
CATH JOSEPHSON QUAD-ALLRED 6FR (CATHETERS) ×1 IMPLANT
CATH SMTCH THERMOCOOL SF DF (CATHETERS) ×1 IMPLANT
CATH WEBSTER BI DIR CS D-F CRV (CATHETERS) ×1 IMPLANT
CLOSURE PERCLOSE PROSTYLE (VASCULAR PRODUCTS) ×3 IMPLANT
MAT PREVALON FULL STRYKER (MISCELLANEOUS) ×1 IMPLANT
PACK EP LATEX FREE (CUSTOM PROCEDURE TRAY) ×2
PACK EP LF (CUSTOM PROCEDURE TRAY) ×1 IMPLANT
PAD PRO RADIOLUCENT 2001M-C (PAD) ×2 IMPLANT
PATCH CARTO3 (PAD) ×1 IMPLANT
SHEATH PINNACLE 7F 10CM (SHEATH) ×1 IMPLANT
SHEATH PINNACLE 8F 10CM (SHEATH) ×2 IMPLANT
SHEATH PROBE COVER 6X72 (BAG) ×1 IMPLANT
TUBING SMART ABLATE COOLFLOW (TUBING) ×1 IMPLANT

## 2020-12-20 NOTE — Anesthesia Postprocedure Evaluation (Signed)
Anesthesia Post Note  Patient: Anne Casey  Procedure(s) Performed: SVT ABLATION (N/A )     Patient location during evaluation: PACU Anesthesia Type: MAC Level of consciousness: awake and alert and oriented Pain management: pain level controlled Vital Signs Assessment: post-procedure vital signs reviewed and stable Respiratory status: spontaneous breathing, nonlabored ventilation and respiratory function stable Cardiovascular status: blood pressure returned to baseline Postop Assessment: no apparent nausea or vomiting Anesthetic complications: no   No complications documented.  Last Vitals:  Vitals:   12/20/20 1310 12/20/20 1315  BP: (!) 162/85 (!) 155/85  Pulse: 82 80  Resp: 16 17  Temp: (!) 36.4 C   SpO2: 96% 97%    Last Pain:  Vitals:   12/20/20 1240  TempSrc:   PainSc: 0-No pain                 Brennan Bailey

## 2020-12-20 NOTE — Transfer of Care (Signed)
Immediate Anesthesia Transfer of Care Note  Patient: Anne Casey  Procedure(s) Performed: SVT ABLATION (N/A )  Patient Location: PACU  Anesthesia Type:MAC  Level of Consciousness: awake, alert  and oriented  Airway & Oxygen Therapy: Patient Spontanous Breathing and Patient connected to nasal cannula oxygen  Post-op Assessment: Report given to RN and Post -op Vital signs reviewed and stable  Post vital signs: Reviewed and stable  Last Vitals:  Vitals Value Taken Time  BP 165/76 12/20/20 1237  Temp    Pulse 89 12/20/20 1237  Resp 15 12/20/20 1237  SpO2 100 % 12/20/20 1237  Vitals shown include unvalidated device data.  Last Pain:  Vitals:   12/20/20 0906  TempSrc:   PainSc: 0-No pain         Complications: No complications documented.

## 2020-12-20 NOTE — H&P (Signed)
Electrophysiology Office Note:    Date:  11/09/2020   ID:  Anne Casey, DOB Apr 30, 1971, MRN [id number]  PCP:  Pearline Cables, MD      [facility] Cardiologist:  No primary care provider on file.  [facility] Electrophysiologist:  None   Referring MD: Garwin Brothers, MD   Chief Complaint: SVT  History of Present Illness:    Anne Casey is a 50 y.o. female who presents for an evaluation of SVT at the request of Dr. Tomie China. Their medical history includes palpitations, obesity.  She was last seen by Dr. Tomie China on November 08, 2020.  At that appointment she reported going to the emergency department several months ago with an episode of SVT.  She is tried to take metoprolol and flecainide but does not tolerate these medications very well.  She was seen in the emergency department on September 08, 2020 with an episode of palpitations and chest tightness.  Her heart rate at the time was 185 bpm she was given a dose of adenosine which converted her back to normal rhythm.  She is with her niece today.  They tell me that she has been to the emergency department around 10 times for the same rhythm.  Each time it is successfully treated with adenosine except for one occasion which it was treated with cardioversion.  The cardioversion was performed because her blood pressure was low during the tachycardia.         Past Medical History:  Diagnosis Date  . High risk medication use 05/29/2019  . History of Holter monitoring 09/2017   sinus tachycardia  . Morbid obesity (HCC) 12/29/2019  . Palpitation   . SVT (supraventricular tachycardia) (HCC)          Past Surgical History:  Procedure Laterality Date  . NO PAST SURGERIES      Current Medications: Active Medications      Current Meds  Medication Sig  . EPINEPHrine (EPIPEN 2-PAK) 0.3 mg/0.3 mL IJ SOAJ injection Inject 0.3 mLs (0.3 mg total) into the muscle as needed for anaphylaxis.  .  flecainide (TAMBOCOR) 50 MG tablet TAKE 1 TABLET(50 MG) BY MOUTH TWICE DAILY  . metoprolol tartrate (LOPRESSOR) 25 MG tablet TAKE 1.5 TABLET(25 MG) BY MOUTH TWICE DAILY  . triamcinolone (KENALOG) 0.1 % Apply 1 application topically 2 (two) times daily. Use as needed for rash on chest       Allergies:   Patient has no known allergies.   Social History        Socioeconomic History  . Marital status: Single    Spouse name: Not on file  . Number of children: Not on file  . Years of education: Not on file  . Highest education level: Not on file  Occupational History  . Not on file  Tobacco Use  . Smoking status: Never Smoker  . Smokeless tobacco: Never Used  Vaping Use  . Vaping Use: Never used  Substance and Sexual Activity  . Alcohol use: No  . Drug use: No  . Sexual activity: Not on file  Other Topics Concern  . Not on file  Social History Narrative  . Not on file   Social Determinants of Health   Financial Resource Strain: Not on file  Food Insecurity: Not on file  Transportation Needs: Not on file  Physical Activity: Not on file  Stress: Not on file  Social Connections: Not on file     Family History: The patient's family  history includes Colon cancer in her mother; Congestive Heart Failure in her father; Heart Problems in her sister; Hypertension in her mother.  ROS:   Please see the history of present illness.    All other systems reviewed and are negative.  EKGs/Labs/Other Studies Reviewed:    The following studies were reviewed today:  09/08/2020 ECG Narrow complex tachycardia    09/08/2020 ECG post adenosine   May 14, 2019 echo personally reviewed Left ventricular function normal, 60% Right ventricular function normal Mildly dilated left atrium No significant valvular abnormalities  Recent Labs: 07/09/2020: TSH 0.974 09/08/2020: ALT 20; BUN 13; Creatinine, Ser 0.75; Hemoglobin 13.4; Platelets 241; Potassium 3.6; Sodium 139   Recent Lipid Panel Labs (Brief)          Component Value Date/Time   CHOL 181 07/09/2020 0847   TRIG 198 (H) 07/09/2020 0847   HDL 38 (L) 07/09/2020 0847   CHOLHDL 4.8 (H) 07/09/2020 0847   CHOLHDL 4 04/30/2019 1341   VLDL 36.2 04/30/2019 1341   LDLCALC 108 (H) 07/09/2020 0847      Physical Exam:    VS:  BP 138/70   Pulse 71   Ht 5\' 7"  (1.702 m)   Wt 264 lb (119.7 kg)   SpO2 97%   BMI 41.35 kg/m        Wt Readings from Last 3 Encounters:  11/09/20 264 lb (119.7 kg)  11/08/20 262 lb 1.6 oz (118.9 kg)  10/25/20 258 lb (117 kg)     GEN:  Well nourished, well developed in no acute distress.  Obese HEENT: Normal NECK: No JVD; No carotid bruits LYMPHATICS: No lymphadenopathy CARDIAC: RRR, no murmurs, rubs, gallops RESPIRATORY:  Clear to auscultation without rales, wheezing or rhonchi  ABDOMEN: Soft, non-tender, non-distended MUSCULOSKELETAL:  No edema; No deformity  SKIN: Warm and dry NEUROLOGIC:  Alert and oriented x 3 PSYCHIATRIC:  Normal affect   ASSESSMENT:    1. SVT (supraventricular tachycardia) (HCC)   2. Morbid obesity (HCC)    PLAN:    In order of problems listed above:  1. SVT Recurrent episodes despite therapy with metoprolol and flecainide.  Highly symptomatic requiring presentation the emergency department for treatment with adenosine.  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  I discussed the treatment options for her SVT including an alternative antiarrhythmic versus EP study and ablation.  I discussed the risks, expected recovery time and expected efficacy rates and they would like to proceed with scheduling an ablation.  We will hold her beta-blocker and flecainide 5 days prior to the procedure.  Therapeutic strategies for supraventricular tachycardia including medicine and ablation were discussed in detail with the patient today. Risk, benefits, and alternatives to  EP study and radiofrequency ablation were also discussed in detail today. These risks include but are not limited to stroke, bleeding, vascular damage, tamponade, perforation, damage to the heart and other structures, AV block requiring pacemaker, worsening renal function, and death. The patient understands these risk and wishes to proceed.  We will therefore proceed with catheter ablation at the next available time.   ------------------------------------------------------------------------------ I have seen, examined the patient, and reviewed the above assessment and plan.    Plan for EP study and possible ablation.   12/23/20, MD 12/20/2020 10:09 AM

## 2020-12-20 NOTE — Discharge Instructions (Signed)
STOP FLECAINIDE   Cardiac Ablation, Care After  This sheet gives you information about how to care for yourself after your procedure. Your health care provider may also give you more specific instructions. If you have problems or questions, contact your health care provider. What can I expect after the procedure? After the procedure, it is common to have:  Bruising around your puncture site.  Tenderness around your puncture site.  Skipped heartbeats.  Tiredness (fatigue).  Follow these instructions at home: Puncture site care   Follow instructions from your health care provider about how to take care of your puncture site. Make sure you: ? If present, leave stitches (sutures), skin glue, or adhesive strips in place. These skin closures may need to stay in place for up to 2 weeks. If adhesive strip edges start to loosen and curl up, you may trim the loose edges. Do not remove adhesive strips completely unless your health care provider tells you to do that. ? If a large square bandage is present, this may be removed 24 hours after surgery.   Check your puncture site every day for signs of infection. Check for: ? Redness, swelling, or pain. ? Fluid or blood. If your puncture site starts to bleed, lie down on your back, apply firm pressure to the area, and contact your health care provider. ? Warmth. ? Pus or a bad smell. Driving  Do not drive for at least 4 days after your procedure or however long your health care provider recommends. (Do not resume driving if you have previously been instructed not to drive for other health reasons.)  Do not drive or use heavy machinery while taking prescription pain medicine. Activity  Avoid activities that take a lot of effort for at least 7 days after your procedure.  Do not lift anything that is heavier than 5 lb (4.5 kg) for one week.   No sexual activity for 1 week.   Return to your normal activities as told by your health care provider.  Ask your health care provider what activities are safe for you. General instructions  Take over-the-counter and prescription medicines only as told by your health care provider.  Do not use any products that contain nicotine or tobacco, such as cigarettes and e-cigarettes. If you need help quitting, ask your health care provider.  You may shower after 24 hours, but Do not take baths, swim, or use a hot tub for 1 week.   Do not drink alcohol for 24 hours after your procedure.  Keep all follow-up visits as told by your health care provider. This is important. Contact a health care provider if:  You have redness, mild swelling, or pain around your puncture site.  You have fluid or blood coming from your puncture site that stops after applying firm pressure to the area.  Your puncture site feels warm to the touch.  You have pus or a bad smell coming from your puncture site.  You have a fever.  You have chest pain or discomfort that spreads to your neck, jaw, or arm.  You are sweating a lot.  You feel nauseous.  You have a fast or irregular heartbeat.  You have shortness of breath.  You are dizzy or light-headed and feel the need to lie down.  You have pain or numbness in the arm or leg closest to your puncture site. Get help right away if:  Your puncture site suddenly swells.  Your puncture site is bleeding and the bleeding  does not stop after applying firm pressure to the area. These symptoms may represent a serious problem that is an emergency. Do not wait to see if the symptoms will go away. Get medical help right away. Call your local emergency services (911 in the [place] Do not drive yourself to the hospital. Summary  After the procedure, it is normal to have bruising and tenderness at the puncture site in your groin, neck, or forearm.  Check your puncture site every day for signs of infection.  Get help right away if your puncture site is bleeding and the bleeding  does not stop after applying firm pressure to the area. This is a medical emergency. This information is not intended to replace advice given to you by your health care provider. Make sure you discuss any questions you have with your health care provider.

## 2020-12-20 NOTE — Progress Notes (Signed)
Pt stated at time of discharge summary review that she was also on flecainide.  I added it and will reprint her AVS

## 2020-12-21 ENCOUNTER — Encounter (HOSPITAL_COMMUNITY): Payer: Self-pay | Admitting: Cardiology

## 2020-12-28 ENCOUNTER — Ambulatory Visit: Payer: BC Managed Care – PPO | Admitting: Cardiology

## 2020-12-31 NOTE — Telephone Encounter (Signed)
Spoke with pt re: complaint of mild chest discomfort. Pt reports discomfort has been present since having her ablation on 12/20/2020 and is worse laying on either side.  She has not taken any tylenol or medication for the discomfort as she says it is not bad enough for that. She states going back to work yesterday and working for 6 hours was a struggle for her body.  She is also concerned re: her HR of 93 after some house work and showering.  Offer reassurance that normal HR is between 60-100bpm.  Increase in HR is normal with activity.  Pt advised to continue to monitor HR and rest as needed.  Reviewed ED precautions.  Pt verbalizes understanding and thanked Charity fundraiser for the call.

## 2021-01-18 ENCOUNTER — Encounter: Payer: Self-pay | Admitting: Cardiology

## 2021-01-18 ENCOUNTER — Ambulatory Visit (INDEPENDENT_AMBULATORY_CARE_PROVIDER_SITE_OTHER): Payer: BC Managed Care – PPO | Admitting: Cardiology

## 2021-01-18 ENCOUNTER — Other Ambulatory Visit: Payer: Self-pay

## 2021-01-18 VITALS — BP 130/84 | HR 75 | Ht 67.0 in | Wt 261.0 lb

## 2021-01-18 DIAGNOSIS — I471 Supraventricular tachycardia: Secondary | ICD-10-CM

## 2021-01-18 MED ORDER — METOPROLOL TARTRATE 25 MG PO TABS: 12.5000 mg | ORAL_TABLET | Freq: Two times a day (BID) | ORAL | 0 refills | Status: DC

## 2021-01-18 NOTE — Progress Notes (Signed)
Electrophysiology Office Follow up Visit Note:    Date:  01/18/2021   ID:  Anne Casey, DOB July 27, 1971, MRN [id number]  PCP:  Pearline Cables, MD  [facility] Cardiologist:  No primary care provider on file.  [facility] Electrophysiologist:  Lanier Prude, MD    Interval History:    Anne Casey is a 50 y.o. 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  She tolerated the procedure well.  She has had no recurrent episodes of arrhythmias since the ablation.  Her groin sites healed well.  She has stopped her flecainide and feels better since stopping the medication.    Past Medical History:  Diagnosis Date  . High risk medication use 05/29/2019  . History of Holter monitoring 09/2017   sinus tachycardia  . Morbid obesity (HCC) 12/29/2019  . Palpitation   . SVT (supraventricular tachycardia) (HCC)     Past Surgical History:  Procedure Laterality Date  . NO PAST SURGERIES    . SVT ABLATION N/A 12/20/2020   Procedure: SVT ABLATION;  Surgeon: Lanier Prude, MD;  Location: [facility] INVASIVE CV LAB;  Service: Cardiovascular;  Laterality: N/A;    Current Medications: Current Meds  Medication Sig  . EPINEPHrine (EPIPEN 2-PAK) 0.3 mg/0.3 mL IJ SOAJ injection Inject 0.3 mLs (0.3 mg total) into the muscle as needed for anaphylaxis.  Marland Kitchen triamcinolone (KENALOG) 0.1 % Apply 1 application topically 2 (two) times daily. Use as needed for rash on chest  . [DISCONTINUED] metoprolol tartrate (LOPRESSOR) 25 MG tablet TAKE 1.5 TABLET(25 MG) BY MOUTH TWICE DAILY (Patient taking differently: Take 37.5 mg by mouth 2 (two) times daily.)     Allergies:   Patient has no known allergies.   Social History   Socioeconomic History  . Marital status: Single    Spouse name: Not on file  . Number of children: Not on file  . Years of education: Not on file  . Highest education level: Not on file  Occupational History  . Not on  file  Tobacco Use  . Smoking status: Never Smoker  . Smokeless tobacco: Never Used  Vaping Use  . Vaping Use: Never used  Substance and Sexual Activity  . Alcohol use: No  . Drug use: No  . Sexual activity: Not on file  Other Topics Concern  . Not on file  Social History Narrative  . Not on file   Social Determinants of Health   Financial Resource Strain: Not on file  Food Insecurity: Not on file  Transportation Needs: Not on file  Physical Activity: Not on file  Stress: Not on file  Social Connections: Not on file     Family History: The patient's family history includes Colon cancer in her mother; Congestive Heart Failure in her father; Heart Problems in her sister; Hypertension in her mother.  ROS:   Please see the history of present illness.    All other systems reviewed and are negative.  EKGs/Labs/Other Studies Reviewed:    The following studies were reviewed today:    EKG:  The ekg ordered today demonstrates sinus rhythm.  Intervals normal.  Recent Labs: 09/08/2020: ALT 20 11/18/2020: Magnesium 1.9; TSH 2.181 12/17/2020: BUN 10; Creatinine, Ser 0.67; Hemoglobin 13.8; Platelets 233; Potassium 3.8; Sodium 139  Recent Lipid Panel    Component Value Date/Time   CHOL 181 07/09/2020 0847   TRIG 198 (H) 07/09/2020 1017  HDL 38 (L) 07/09/2020 0847   CHOLHDL 4.8 (H) 07/09/2020 0847   CHOLHDL 4 04/30/2019 1341   VLDL 36.2 04/30/2019 1341   LDLCALC 108 (H) 07/09/2020 0847    Physical Exam:    VS:  BP 130/84   Pulse 75   Ht 5\' 7"  (1.702 m)   Wt 261 lb (118.4 kg)   SpO2 94%   BMI 40.88 kg/m     Wt Readings from Last 3 Encounters:  01/18/21 261 lb (118.4 kg)  12/20/20 257 lb (116.6 kg)  11/09/20 264 lb (119.7 kg)     GEN:  Well nourished, well developed in no acute distress HEENT: Normal NECK: No JVD; No carotid bruits LYMPHATICS: No lymphadenopathy CARDIAC: RRR, no murmurs, rubs, gallops RESPIRATORY:  Clear to auscultation without rales, wheezing or  rhonchi  ABDOMEN: Soft, non-tender, non-distended MUSCULOSKELETAL:  No edema; No deformity  SKIN: Warm and dry NEUROLOGIC:  Alert and oriented x 3 PSYCHIATRIC:  Normal affect   ASSESSMENT:    1. SVT (supraventricular tachycardia) (HCC)    PLAN:    In order of problems listed above:  1. Doing well post slow pathway modification on December 20, 2020.  No recurrence of arrhythmia.  Has since stopped her flecainide and feels better now that she is off this medication.  I recommended that she reduce her dose of metoprolol tartrate to 12.5 mg by mouth twice daily for the next 5 days then stop the medication.  I will plan to see her back as needed.  Medication Adjustments/Labs and Tests Ordered: Current medicines are reviewed at length with the patient today.  Concerns regarding medicines are outlined above.  Orders Placed This Encounter  Procedures  . EKG 12-Lead   Meds ordered this encounter  Medications  . metoprolol tartrate (LOPRESSOR) 25 MG tablet    Sig: Take 0.5 tablets (12.5 mg total) by mouth 2 (two) times daily for 5 days. TAKE 1.5 TABLET(25 MG) BY MOUTH TWICE DAILY    Dispense:  5 tablet    Refill:  0     Signed, December 22, 2020, MD, [facility]  01/18/2021 6:55 PM    Electrophysiology [facility]

## 2021-01-18 NOTE — Patient Instructions (Addendum)
Medication Instructions:  Reduce your Metoprolol tartrate to 12.5 mg two times a day for 5 day, then stop.   Your physician recommends that you continue on your current medications as directed. Please refer to the Current Medication list given to you today.  Labwork: None ordered.  Testing/Procedures: None ordered.  Follow-Up: Your physician wants you to follow-up in: As Needed with Steffanie Dunn, MD  Any Other Special Instructions Will Be Listed Below (If Applicable).  If you need a refill on your cardiac medications before your next appointment, please call your pharmacy.

## 2021-02-02 ENCOUNTER — Other Ambulatory Visit: Payer: Self-pay | Admitting: Family Medicine

## 2021-02-08 ENCOUNTER — Other Ambulatory Visit: Payer: Self-pay

## 2021-02-14 ENCOUNTER — Telehealth (INDEPENDENT_AMBULATORY_CARE_PROVIDER_SITE_OTHER): Payer: BC Managed Care – PPO | Admitting: Cardiology

## 2021-02-14 ENCOUNTER — Telehealth: Payer: Self-pay

## 2021-02-14 ENCOUNTER — Encounter: Payer: Self-pay | Admitting: Cardiology

## 2021-02-14 VITALS — BP 152/94 | HR 102 | Ht 67.0 in | Wt 258.0 lb

## 2021-02-14 DIAGNOSIS — E782 Mixed hyperlipidemia: Secondary | ICD-10-CM

## 2021-02-14 DIAGNOSIS — I10 Essential (primary) hypertension: Secondary | ICD-10-CM

## 2021-02-14 DIAGNOSIS — I471 Supraventricular tachycardia: Secondary | ICD-10-CM

## 2021-02-14 HISTORY — DX: Essential (primary) hypertension: I10

## 2021-02-14 MED ORDER — METOPROLOL TARTRATE 25 MG PO TABS: 25.0000 mg | ORAL_TABLET | Freq: Two times a day (BID) | ORAL | 3 refills | Status: DC

## 2021-02-14 NOTE — Telephone Encounter (Signed)
  Patient Consent for Virtual Visit         Anne Casey has provided verbal consent on 02/14/2021 for a virtual visit (video or telephone).   CONSENT FOR VIRTUAL VISIT FOR:  Anne Casey  By participating in this virtual visit I agree to the following:  I hereby voluntarily request, consent and authorize [facility] and its employed or contracted physicians, physician assistants, nurse practitioners or other licensed health care professionals (the Practitioner), to provide me with telemedicine health care services (the "Services") as deemed necessary by the treating Practitioner. I acknowledge and consent to receive the Services by the Practitioner via telemedicine. I understand that the telemedicine visit will involve communicating with the Practitioner through live audiovisual communication technology and the disclosure of certain medical information by electronic transmission. I acknowledge that I have been given the opportunity to request an in-person assessment or other available alternative prior to the telemedicine visit and am voluntarily participating in the telemedicine visit.  I understand that I have the right to withhold or withdraw my consent to the use of telemedicine in the course of my care at any time, without affecting my right to future care or treatment, and that the Practitioner or I may terminate the telemedicine visit at any time. I understand that I have the right to inspect all information obtained and/or recorded in the course of the telemedicine visit and may receive copies of available information for a reasonable fee.  I understand that some of the potential risks of receiving the Services via telemedicine include:  Marland Kitchen Delay or interruption in medical evaluation due to technological equipment failure or disruption; . Information transmitted may not be sufficient (e.g. poor resolution of images) to allow for appropriate medical decision making by the Practitioner;  and/or  . In rare instances, security protocols could fail, causing a breach of personal health information.  Furthermore, I acknowledge that it is my responsibility to provide information about my medical history, conditions and care that is complete and accurate to the best of my ability. I acknowledge that Practitioner's advice, recommendations, and/or decision may be based on factors not within their control, such as incomplete or inaccurate data provided by me or distortions of diagnostic images or specimens that may result from electronic transmissions. I understand that the practice of medicine is not an exact science and that Practitioner makes no warranties or guarantees regarding treatment outcomes. I acknowledge that a copy of this consent can be made available to me via my patient portal [facility] MyChart), or I can request a printed copy by calling the office of [facility].    I understand that my insurance will be billed for this visit.   I have read or had this consent read to me. . I understand the contents of this consent, which adequately explains the benefits and risks of the Services being provided via telemedicine.  . I have been provided ample opportunity to ask questions regarding this consent and the Services and have had my questions answered to my satisfaction. . I give my informed consent for the services to be provided through the use of telemedicine in my medical care

## 2021-02-14 NOTE — Progress Notes (Signed)
Virtual Visit via Telephone Note   This visit type was conducted due to national recommendations for restrictions regarding the COVID-19 Pandemic (e.g. social distancing) in an effort to limit this patient's exposure and mitigate transmission in our community.  Due to her co-morbid illnesses, this patient is at least at moderate risk for complications without adequate follow up.  This format is felt to be most appropriate for this patient at this time.  The patient did not have access to video technology/had technical difficulties with video requiring transitioning to audio format only (telephone).  All issues noted in this document were discussed and addressed.  No physical exam could be performed with this format.  Please refer to the patient's chart for her  consent to telehealth for [facility].    Date:  02/14/2021   ID:  Anne Casey, DOB [birth year], MRN [id number] The patient was identified using 2 identifiers.  Patient Location: Home Provider Location: Home Office   PCP:  Copland, Gwenlyn Found, MD   [facility]  Cardiologist:  No primary care provider on file.  Advanced Practice Provider:  No care team member to display Electrophysiologist:  Lanier Prude, MD  {  Evaluation Performed:  Follow-Up Visit  Chief Complaint:  Palpitations, s/p ablation.  Patient is here for follow-up  History of Present Illness:    Anne Casey is a 50 y.o. female with past medical history of supraventricular tachycardia post ablation.  Patient underwent ablation and is happy about it.  Her only concern is that her blood pressure is elevated as her metoprolol was reduced.  She is of the ligament.  She denies any chest pain orthopnea or PND.  At the time of my evaluation, the patient is alert awake oriented and in no distress.  She is walking on a regular basis.  The patient does not have symptoms concerning for COVID-19 infection (fever, chills, cough, or new shortness  of breath).    Past Medical History:  Diagnosis Date  . High risk medication use 05/29/2019  . History of Holter monitoring 09/2017   sinus tachycardia  . Mixed dyslipidemia 11/08/2020  . Morbid obesity (HCC) 12/29/2019  . Palpitation   . SVT (supraventricular tachycardia) (HCC)    Past Surgical History:  Procedure Laterality Date  . NO PAST SURGERIES    . SVT ABLATION N/A 12/20/2020   Procedure: SVT ABLATION;  Surgeon: Lanier Prude, MD;  Location: [facility] INVASIVE CV LAB;  Service: Cardiovascular;  Laterality: N/A;     Current Meds  Medication Sig  . EPINEPHrine (EPIPEN 2-PAK) 0.3 mg/0.3 mL IJ SOAJ injection Inject 0.3 mLs (0.3 mg total) into the muscle as needed for anaphylaxis.  . metoprolol tartrate (LOPRESSOR) 25 MG tablet Take 25 mg by mouth daily at 12 noon.  . triamcinolone (KENALOG) 0.1 % Apply 1 application topically 2 (two) times daily. Use as needed for rash on chest     Allergies:   Patient has no known allergies.   Social History   Tobacco Use  . Smoking status: Never Smoker  . Smokeless tobacco: Never Used  Vaping Use  . Vaping Use: Never used  Substance Use Topics  . Alcohol use: No  . Drug use: No     Family Hx: The patient's family history includes Colon cancer in her mother; Congestive Heart Failure in her father; Heart Problems in her sister; Hypertension in her mother.  ROS:   Please see the history of present illness.  I reviewed the ablation notes the procedure notes and discussed with the patient at length All other systems reviewed and are negative.   Prior CV studies:   The following studies were reviewed today:  Results of blood work done in the past were reviewed  Labs/Other Tests and Data Reviewed:    EKG:  EKG from prior visit was reviewed.  Recent Labs: 09/08/2020: ALT 20 11/18/2020: Magnesium 1.9; TSH 2.181 12/17/2020: BUN 10; Creatinine, Ser 0.67; Hemoglobin 13.8; Platelets 233; Potassium 3.8; Sodium 139   Recent Lipid  Panel Lab Results  Component Value Date/Time   CHOL 181 07/09/2020 08:47 AM   TRIG 198 (H) 07/09/2020 08:47 AM   HDL 38 (L) 07/09/2020 08:47 AM   CHOLHDL 4.8 (H) 07/09/2020 08:47 AM   CHOLHDL 4 04/30/2019 01:41 PM   LDLCALC 108 (H) 07/09/2020 08:47 AM    Wt Readings from Last 3 Encounters:  02/14/21 258 lb (117 kg)  01/18/21 261 lb (118.4 kg)  12/20/20 257 lb (116.6 kg)     Risk Assessment/Calculations:      Objective:    Vital Signs:  BP (!) 152/94   Pulse (!) 102   Ht 5\' 7"  (1.702 m)   Wt 258 lb (117 kg)   BMI 40.41 kg/m    VITAL SIGNS:  reviewed  ASSESSMENT & PLAN:    1. Supraventricular tachycardia post ablation: Patient is very happy about the outcome of the results of the ablation.  She has had no palpitations.  No chest pain orthopnea 2. Hypertension: Blood pressure is elevated.  Lifestyle patient was advised.  Salt intake issues were discussed with the patient at length and she vocalized understanding.  She will keep a track of pulse and blood pressure.  I increased metoprolol to 25 mg daily and she will be seen in follow-up appointment in a month engaged and we will review this. 3. Obesity: Weight reduction was stressed and risks of obesity explained and she promises to do better.  Diet was emphasized.        COVID-19 Education: The signs and symptoms of COVID-19 were discussed with the patient and how to seek care for testing (follow up with PCP or arrange E-visit).  The importance of social distancing was discussed today.  Time:   Today, I have spent 15 minutes with the patient with telehealth technology discussing the above problems.     Medication Adjustments/Labs and Tests Ordered: Current medicines are reviewed at length with the patient today.  Concerns regarding medicines are outlined above.   Tests Ordered: No orders of the defined types were placed in this encounter.   Medication Changes: No orders of the defined types were placed in this  encounter.   Follow Up:  In Person in 1 month(s)  Signed, , MD  02/14/2021 10:25 AM    [facility]

## 2021-03-17 ENCOUNTER — Ambulatory Visit: Payer: BC Managed Care – PPO | Admitting: Cardiology

## 2021-05-09 ENCOUNTER — Encounter: Payer: Self-pay | Admitting: Cardiology

## 2021-05-09 ENCOUNTER — Ambulatory Visit (INDEPENDENT_AMBULATORY_CARE_PROVIDER_SITE_OTHER): Payer: BC Managed Care – PPO | Admitting: Cardiology

## 2021-05-09 ENCOUNTER — Other Ambulatory Visit: Payer: Self-pay

## 2021-05-09 VITALS — BP 134/80 | HR 82 | Ht 67.0 in | Wt 252.1 lb

## 2021-05-09 DIAGNOSIS — I10 Essential (primary) hypertension: Secondary | ICD-10-CM | POA: Diagnosis not present

## 2021-05-09 DIAGNOSIS — E782 Mixed hyperlipidemia: Secondary | ICD-10-CM

## 2021-05-09 NOTE — Patient Instructions (Signed)

## 2021-05-09 NOTE — Progress Notes (Signed)
Cardiology Office Note:    Date:  05/09/2021   ID:  [name], DOB [birth year], MRN [id number]  PCP:  Pearline Cables, MD  Cardiologist:  Garwin Brothers, MD   Referring MD: Pearline Cables, MD    ASSESSMENT:    1. Essential hypertension   2. Morbid obesity (HCC)   3. Mixed dyslipidemia    PLAN:    In order of problems listed above:  Primary prevention stressed with the patient.  Importance of compliance with diet medication stressed and she vocalized understanding.  She was advised to walk at least half an hour a day 5 days a week and she promises to do so. Supraventricular tachycardia post ablation: Patient has not had any significant issues with palpitations or any problems.  She is happy about the fact she has undergone ablation. Obesity: Weight reduction was stressed diet was emphasized lipids reviewed and lifestyle modification urged and she promises to do so. Patient will be seen in follow-up appointment in 6 months or earlier if the patient has any concerns    Medication Adjustments/Labs and Tests Ordered: Current medicines are reviewed at length with the patient today.  Concerns regarding medicines are outlined above.  No orders of the defined types were placed in this encounter.  No orders of the defined types were placed in this encounter.    No chief complaint on file.    History of Present Illness:    Anne Casey is a 50 y.o. female.  Patient has past medical history of supraventricular tachycardia and palpitations.  She is morbidly obese and leads a sedentary lifestyle.  She works long hours.  No chest pain orthopnea or PND.  At the time of my evaluation, the patient is alert awake oriented and in no distress.  Past Medical History:  Diagnosis Date   Essential hypertension 02/14/2021   High risk medication use 05/29/2019   History of Holter monitoring 09/2017   sinus tachycardia   Mixed dyslipidemia 11/08/2020   Morbid obesity (HCC) 12/29/2019    Palpitation    SVT (supraventricular tachycardia) (HCC)     Past Surgical History:  Procedure Laterality Date   NO PAST SURGERIES     SVT ABLATION N/A 12/20/2020   Procedure: SVT ABLATION;  Surgeon: Lanier Prude, MD;  Location: [facility] INVASIVE CV LAB;  Service: Cardiovascular;  Laterality: N/A;    Current Medications: Current Meds  Medication Sig   EPINEPHrine (EPIPEN 2-PAK) 0.3 mg/0.3 mL IJ SOAJ injection Inject 0.3 mLs (0.3 mg total) into the muscle as needed for anaphylaxis.   metoprolol tartrate (LOPRESSOR) 25 MG tablet Take 1 tablet (25 mg total) by mouth 2 (two) times daily.   triamcinolone (KENALOG) 0.1 % Apply 1 application topically 2 (two) times daily. Use as needed for rash on chest     Allergies:   Patient has no known allergies.   Social History   Socioeconomic History   Marital status: Single    Spouse name: Not on file   Number of children: Not on file   Years of education: Not on file   Highest education level: Not on file  Occupational History   Not on file  Tobacco Use   Smoking status: Never   Smokeless tobacco: Never  Vaping Use   Vaping Use: Never used  Substance and Sexual Activity   Alcohol use: No   Drug use: No   Sexual activity: Not on file  Other Topics Concern   Not on file  Social History Narrative   Not on file   Social Determinants of Health   Financial Resource Strain: Not on file  Food Insecurity: Not on file  Transportation Needs: Not on file  Physical Activity: Not on file  Stress: Not on file  Social Connections: Not on file     Family History: The patient's family history includes Colon cancer in her mother; Congestive Heart Failure in her father; Heart Problems in her sister; Hypertension in her mother.  ROS:   Please see the history of present illness.    All other systems reviewed and are negative.  EKGs/Labs/Other Studies Reviewed:    The following studies were reviewed today: I discussed my findings with the  patient at length.   Recent Labs: 09/08/2020: ALT 20 11/18/2020: Magnesium 1.9; TSH 2.181 12/17/2020: BUN 10; Creatinine, Ser 0.67; Hemoglobin 13.8; Platelets 233; Potassium 3.8; Sodium 139  Recent Lipid Panel    Component Value Date/Time   CHOL 181 07/09/2020 0847   TRIG 198 (H) 07/09/2020 0847   HDL 38 (L) 07/09/2020 0847   CHOLHDL 4.8 (H) 07/09/2020 0847   CHOLHDL 4 04/30/2019 1341   VLDL 36.2 04/30/2019 1341   LDLCALC 108 (H) 07/09/2020 0847    Physical Exam:    VS:  BP 134/80   Pulse 82   Ht 5\' 7"  (1.702 m)   Wt 252 lb 1.3 oz (114.3 kg)   SpO2 99%   BMI 39.48 kg/m     Wt Readings from Last 3 Encounters:  05/09/21 252 lb 1.3 oz (114.3 kg)  02/14/21 258 lb (117 kg)  01/18/21 261 lb (118.4 kg)     GEN: Patient is in no acute distress HEENT: Normal NECK: No JVD; No carotid bruits LYMPHATICS: No lymphadenopathy CARDIAC: Hear sounds regular, 2/6 systolic murmur at the apex. RESPIRATORY:  Clear to auscultation without rales, wheezing or rhonchi  ABDOMEN: Soft, non-tender, non-distended MUSCULOSKELETAL:  No edema; No deformity  SKIN: Warm and dry NEUROLOGIC:  Alert and oriented x 3 PSYCHIATRIC:  Normal affect   Signed, 03/20/21, MD  05/09/2021 1:23 PM    [facility]

## 2021-06-30 IMAGING — CT CT ANGIO CHEST
2 of 7 series · 17 of 46 positions shown · IV contrast (APPLIED)
Comparison: Film from earlier in the same day, CT from 01/16/2019

CLINICAL DATA: Chest tightness and tachycardia

EXAM:
CT ANGIOGRAPHY CHEST WITH CONTRAST
TECHNIQUE: Multidetector CT imaging of the chest was performed using the
standard protocol during bolus administration of intravenous
contrast. Multiplanar CT image reconstructions and MIPs were
obtained to evaluate the vascular anatomy.
CONTRAST:  75mL OMNIPAQUE IOHEXOL 350 MG/ML SOLN

[Series 7: thins · axial · 0.72mm/px · z∈[-422,-198]mm · 14 of 359 slices shown]
[im 20/359  lung]
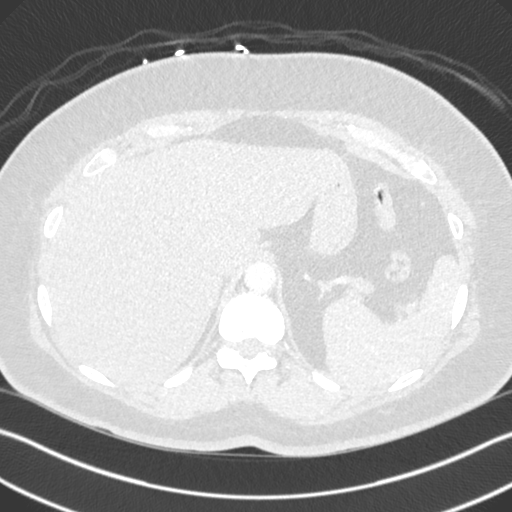
[im 40/359  soft-tissue]
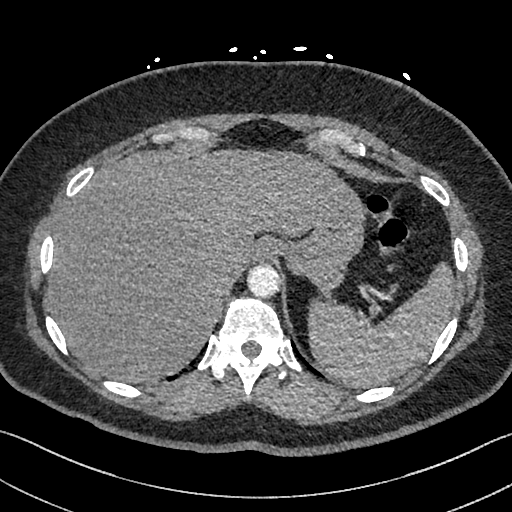
[im 80/359  lung]
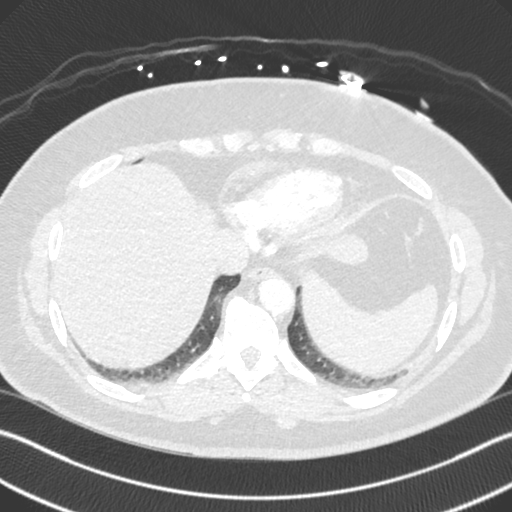
[im 100/359  soft-tissue]
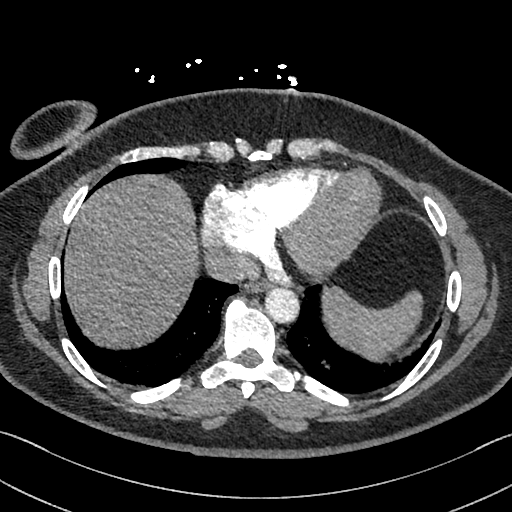
[im 120/359  lung]
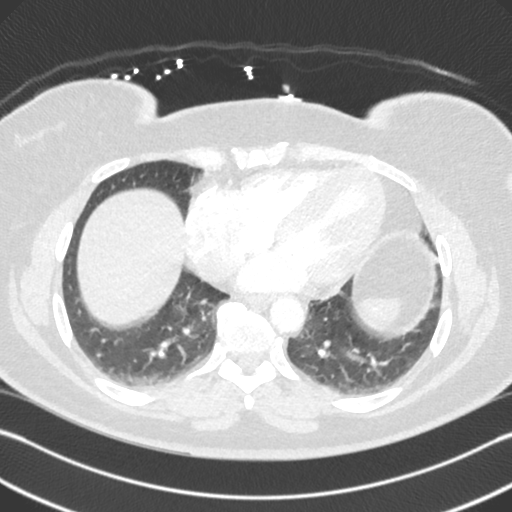
[im 140/359  soft-tissue]
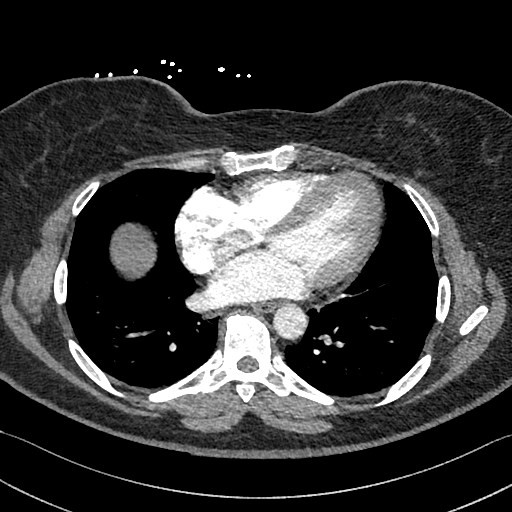
[im 160/359  lung]
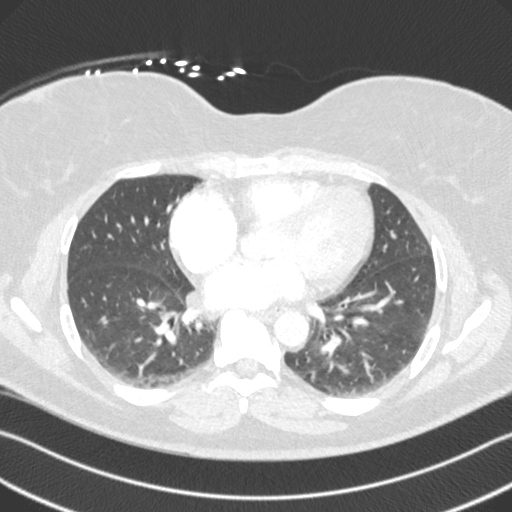
[im 199/359  soft-tissue]
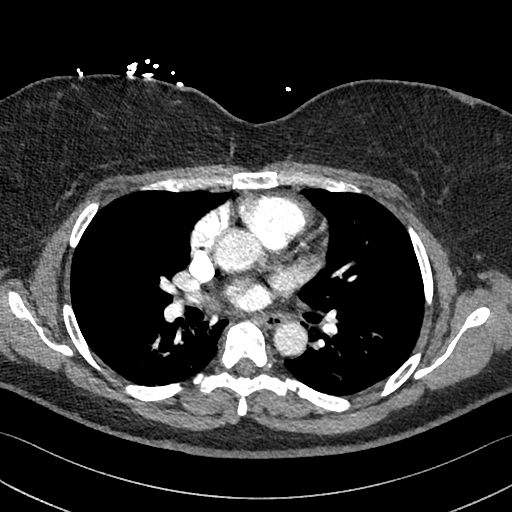
[im 219/359  lung]
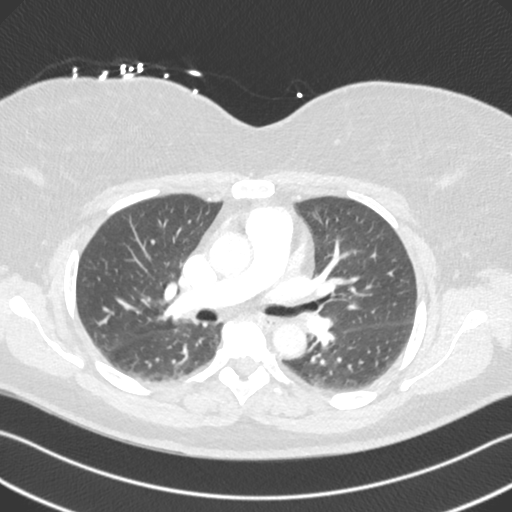
[im 239/359  soft-tissue]
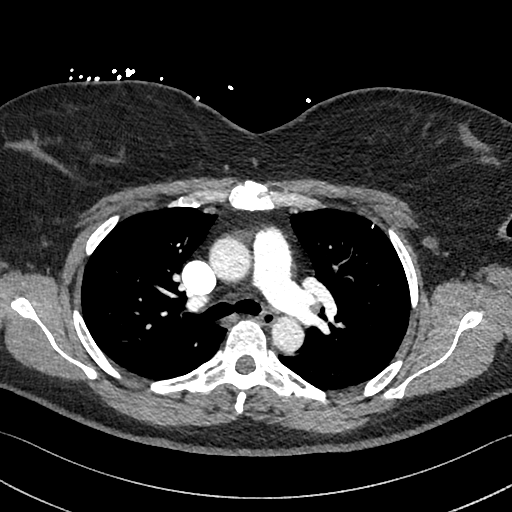
[im 259/359  lung]
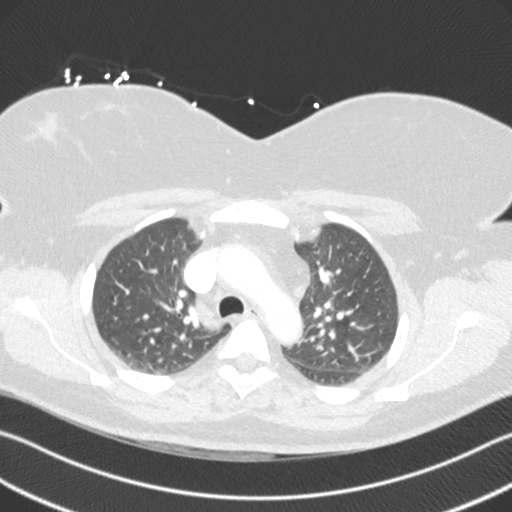
[im 279/359  soft-tissue]
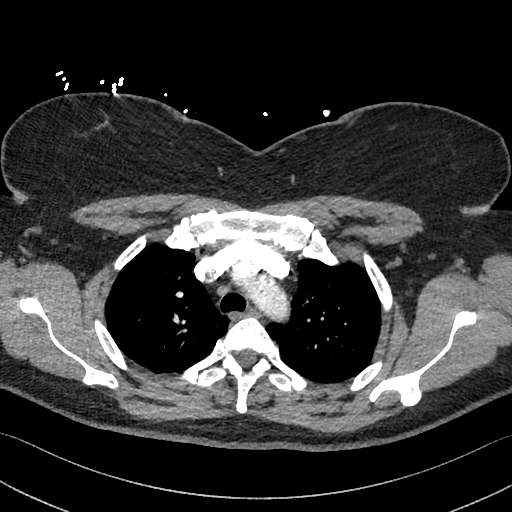
[im 319/359  lung]
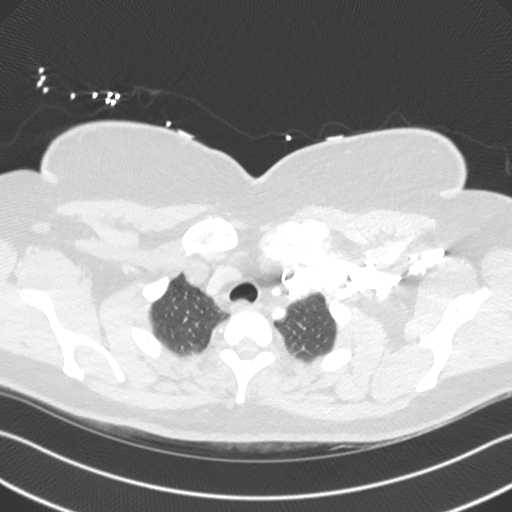
[im 339/359  soft-tissue]
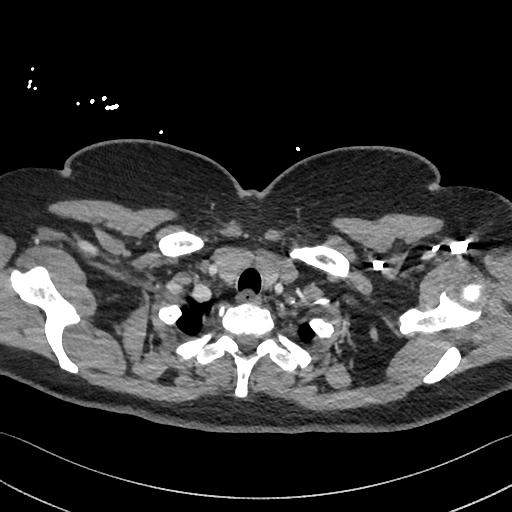

[Series 8: cor · coronal · 0.50mm/px · 3 of 148 slices shown]
[im 37/148  soft-tissue]
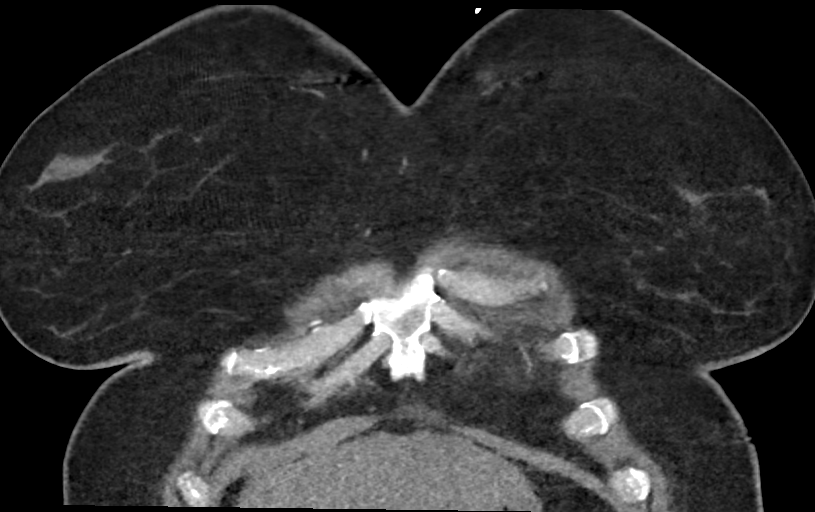
[im 74/148  soft-tissue]
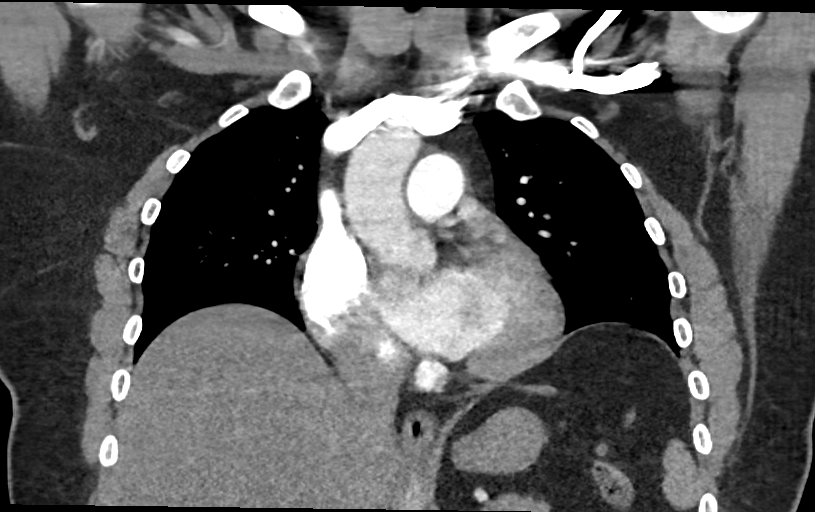
[im 111/148  soft-tissue]
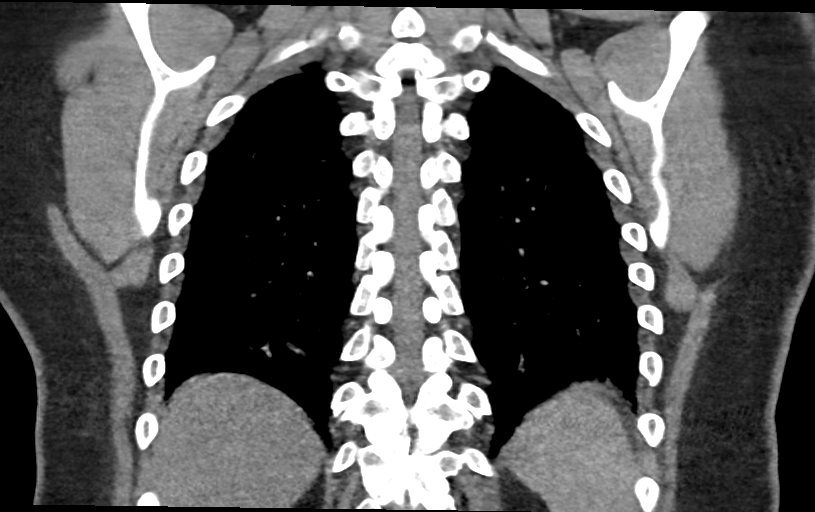

[17 of 46 positions shown; findings below may reference images not displayed]

FINDINGS: Cardiovascular: Thoracic aorta shows a normal branching pattern. No
significant cardiomegaly is noted. No significant coronary
calcifications are noted. The pulmonary artery shows a normal
branching pattern. No definitive filling defects to suggest
pulmonary emboli are noted.

Mediastinum/Nodes: Thoracic inlet is within normal limits. No hilar
or mediastinal adenopathy is noted. The esophagus is within normal
limits.

Lungs/Pleura: Mild dependent atelectatic changes are noted. No focal
infiltrate or sizable effusion is noted. No sizable parenchymal
nodules are seen.

Upper Abdomen: Visualized upper abdomen shows fatty infiltration of
the liver.

Musculoskeletal: Degenerative changes of the thoracic spine are
noted.

Review of the MIP images confirms the above findings.
IMPRESSION: No evidence of pulmonary emboli.

Mild dependent atelectatic changes are noted. No acute abnormality
seen.

## 2021-07-08 ENCOUNTER — Other Ambulatory Visit: Payer: Self-pay | Admitting: Family Medicine

## 2021-07-08 ENCOUNTER — Telehealth: Payer: Self-pay

## 2021-07-08 DIAGNOSIS — R21 Rash and other nonspecific skin eruption: Secondary | ICD-10-CM

## 2021-07-08 NOTE — Telephone Encounter (Signed)
Pt called to request refill on triamcinolone cream to [facility] in [place]. Informed it has been refilled.

## 2021-08-04 ENCOUNTER — Other Ambulatory Visit: Payer: Self-pay | Admitting: Family Medicine

## 2021-09-14 ENCOUNTER — Ambulatory Visit (INDEPENDENT_AMBULATORY_CARE_PROVIDER_SITE_OTHER): Payer: BC Managed Care – PPO | Admitting: Cardiology

## 2021-09-14 ENCOUNTER — Encounter: Payer: Self-pay | Admitting: Cardiology

## 2021-09-14 ENCOUNTER — Other Ambulatory Visit: Payer: Self-pay

## 2021-09-14 VITALS — BP 146/80 | HR 80 | Ht 67.0 in | Wt 254.1 lb

## 2021-09-14 DIAGNOSIS — I471 Supraventricular tachycardia: Secondary | ICD-10-CM

## 2021-09-14 DIAGNOSIS — E782 Mixed hyperlipidemia: Secondary | ICD-10-CM

## 2021-09-14 DIAGNOSIS — I10 Essential (primary) hypertension: Secondary | ICD-10-CM

## 2021-09-14 DIAGNOSIS — R079 Chest pain, unspecified: Secondary | ICD-10-CM | POA: Insufficient documentation

## 2021-09-14 HISTORY — DX: Chest pain, unspecified: R07.9

## 2021-09-14 NOTE — Progress Notes (Signed)
Cardiology Office Note:    Date:  09/14/2021   ID:  Anne Casey, DOB September 01, 1971, MRN [id number]  PCP:  Darreld Mclean, MD  Cardiologist:  Jenean Lindau, MD   Referring MD: Darreld Mclean, MD    ASSESSMENT:    1. Essential hypertension   2. SVT (supraventricular tachycardia) ([place]), post ablation   3. Mixed dyslipidemia   4. Morbid obesity ([place])   5. Chest pain of uncertain etiology    PLAN:    In order of problems listed above:  I discussed my findings with the patient in extensive length and primary prevention stressed. Chest pain: Atypical in nature however she has risk factors and wants to be evaluated and we will do a stress echo.  If she has any significant symptoms she knows to go to the nearest emergency room. Essential hypertension: Blood pressure stable.  She is worried about her overall health and therefore her blood pressure is elevated.  She has also had an element of whitecoat hypertension. Mixed dyslipidemia and obesity: Lifestyle modification urged risks of obesity explained and he plans to do better.  I will get a lipid check for her in the next few days. Patient will be seen in follow-up appointment in 6 months or earlier if the patient has any concerns    Medication Adjustments/Labs and Tests Ordered: Current medicines are reviewed at length with the patient today.  Concerns regarding medicines are outlined above.  Orders Placed This Encounter  Procedures   Basic metabolic panel   Hepatic function panel   Lipid panel   ECHOCARDIOGRAM STRESS TEST   No orders of the defined types were placed in this encounter.    No chief complaint on file.    History of Present Illness:    Anne Casey is a 50 y.o. female.  Patient has past medical history of essential hypertension, SVT post ablation mixed dyslipidemia and obesity.  She mentions to me that she occasionally has chest discomfort and it concerns her.  This is not related to exertion.  No  orthopnea or PND.  At the time of my evaluation, the patient is alert awake oriented and in no distress.  Past Medical History:  Diagnosis Date   Essential hypertension 02/14/2021   High risk medication use 05/29/2019   History of Holter monitoring 09/2017   sinus tachycardia   Mixed dyslipidemia 11/08/2020   Morbid obesity ([place]) 12/29/2019   Palpitation    SVT (supraventricular tachycardia) ([place])     Past Surgical History:  Procedure Laterality Date   NO PAST SURGERIES     SVT ABLATION N/A 12/20/2020   Procedure: SVT ABLATION;  Surgeon: Vickie Epley, MD;  Location: [place] CV LAB;  Service: Cardiovascular;  Laterality: N/A;    Current Medications: Current Meds  Medication Sig   EPINEPHrine (EPIPEN 2-PAK) 0.3 mg/0.3 mL IJ SOAJ injection Inject 0.3 mLs (0.3 mg total) into the muscle as needed for anaphylaxis.   metoprolol tartrate (LOPRESSOR) 25 MG tablet TAKE 1 AND 1/2 TABLETS(37.5 MG) BY MOUTH TWICE DAILY   triamcinolone cream (KENALOG) 0.1 % APPLY 1 APPLICATION TOPICALLY TWICE DAILY USE AS NEEDED FOR RASH ON CHEST     Allergies:   Patient has no known allergies.   Social History   Socioeconomic History   Marital status: Single    Spouse name: Not on file   Number of children: Not on file   Years of education: Not on file   Highest education level:  Not on file  Occupational History   Not on file  Tobacco Use   Smoking status: Never   Smokeless tobacco: Never  Vaping Use   Vaping Use: Never used  Substance and Sexual Activity   Alcohol use: No   Drug use: No   Sexual activity: Not on file  Other Topics Concern   Not on file  Social History Narrative   Not on file   Social Determinants of Health   Financial Resource Strain: Not on file  Food Insecurity: Not on file  Transportation Needs: Not on file  Physical Activity: Not on file  Stress: Not on file  Social Connections: Not on file     Family History: The patient's family history includes Colon  cancer in her mother; Congestive Heart Failure in her father; Heart Problems in her sister; Hypertension in her mother.  ROS:   Please see the history of present illness.    All other systems reviewed and are negative.  EKGs/Labs/Other Studies Reviewed:    The following studies were reviewed today: I discussed my findings with the patient at length.   Recent Labs: 11/18/2020: Magnesium 1.9; TSH 2.181 12/17/2020: BUN 10; Creatinine, Ser 0.67; Hemoglobin 13.8; Platelets 233; Potassium 3.8; Sodium 139  Recent Lipid Panel    Component Value Date/Time   CHOL 181 07/09/2020 0847   TRIG 198 (H) 07/09/2020 0847   HDL 38 (L) 07/09/2020 0847   CHOLHDL 4.8 (H) 07/09/2020 0847   CHOLHDL 4 04/30/2019 1341   VLDL 36.2 04/30/2019 1341   LDLCALC 108 (H) 07/09/2020 0847    Physical Exam:    VS:  BP (!) 146/80   Pulse 80   Ht 5\' 7"  (1.702 m)   Wt 254 lb 1.3 oz (115.2 kg)   SpO2 98%   BMI 39.79 kg/m     Wt Readings from Last 3 Encounters:  09/14/21 254 lb 1.3 oz (115.2 kg)  05/09/21 252 lb 1.3 oz (114.3 kg)  02/14/21 258 lb (117 kg)     GEN: Patient is in no acute distress HEENT: Normal NECK: No JVD; No carotid bruits LYMPHATICS: No lymphadenopathy CARDIAC: Hear sounds regular, 2/6 systolic murmur at the apex. RESPIRATORY:  Clear to auscultation without rales, wheezing or rhonchi  ABDOMEN: Soft, non-tender, non-distended MUSCULOSKELETAL:  No edema; No deformity  SKIN: Warm and dry NEUROLOGIC:  Alert and oriented x 3 PSYCHIATRIC:  Normal affect   Signed, 04/16/21, MD  09/14/2021 4:38 PM    [facility]

## 2021-09-14 NOTE — Patient Instructions (Addendum)
Medication Instructions:  No medication changes. *If you need a refill on your cardiac medications before your next appointment, please call your pharmacy*   Lab Work: Your physician recommends that you return for lab work in: the next few days. You need to have labs done when you are fasting.  You can come Monday through Friday 8:30 am to 12:00 pm and 1:15 to 4:30. You do not need to make an appointment as the order has already been placed. The labs you are going to have done are BMET,  LFT and Lipids.  If you have labs (blood work) drawn today and your tests are completely normal, you will receive your results only by: MyChart Message (if you have MyChart) OR A paper copy in the mail If you have any lab test that is abnormal or we need to change your treatment, we will call you to review the results.   Testing/Procedures:      Stress Echocardiogram Information Sheet                                                      Instructions:    1. You may take your morning medications the morning of the test  2. Light breakfast no caffeine  3. Dress prepared to exercise.  4. DO NOT use ANY caffeine or tobacco products 3 hours before appointment.  5. Please bring all current prescription medications.    Follow-Up: At [facility], you and your health needs are our priority.  As part of our continuing mission to provide you with exceptional heart care, we have created designated Provider Care Teams.  These Care Teams include your primary Cardiologist (physician) and Advanced Practice Providers (APPs -  Physician Assistants and Nurse Practitioners) who all work together to provide you with the care you need, when you need it.  We recommend signing up for the patient portal called "MyChart".  Sign up information is provided on this After Visit Summary.  MyChart is used to connect with patients for Virtual Visits (Telemedicine).  Patients are able to view lab/test results, encounter notes,  upcoming appointments, etc.  Non-urgent messages can be sent to your provider as well.   To learn more about what you can do with MyChart, go to [email or web address].    Your next appointment:   6 month(s)  The format for your next appointment:   In Person  Provider:   Belva Crome, MD   Other Instructions  Exercise Stress Echocardiogram An exercise stress echocardiogram is a test to check how well your heart is working. This test uses sound waves and a computer to make pictures of your heart. These pictures will be taken before and after you exercise. For this test, you will walk on a treadmill or ride a bicycle to make your heart beat faster. While you exercise, your heart will be checked with an electrocardiogram (ECG). Your blood pressure will also be checked. You may have this test if: You have chest pain or a heart problem. You had a heart attack or heart surgery not long ago. You have heart valve problems. You have a condition that causes narrowing of the blood vessels that supply your heart. You have a high risk of heart disease and: You are starting a new exercise program. You need to have a big surgery. Tell a  doctor about: Any allergies you have. All medicines you are taking. This includes vitamins, herbs, eye drops, creams, and over-the-counter medicines. Any problems you or family members have had with medicines that make you fall asleep (anesthetic medicines). Any surgeries you have had. Any blood disorders you have. Any medical conditions you have. Whether you are pregnant or may be pregnant. What are the risks? Generally, this is a safe test. However, problems may occur, including: Chest pain. Feeling dizzy or light-headed. Shortness of breath. Increased or irregular heartbeat. Feeling like you may vomit (nausea) or vomiting. Heart attack. This is very rare. What happens before the test? Medicines Ask your doctor about changing or stopping your normal  medicines. This is important if you take diabetes medicines or blood thinners. If you use an inhaler, bring it to the test. General instructions Wear comfortable clothes and walking shoes. Follow instructions from your doctor about what you cannot eat or drink before the test. Do not drink or eat anything that has caffeine in it. Stop having caffeine 24 hours before the test. Do not smoke or use products that contain nicotine or tobacco for 4 hours before the test. If you need help quitting, ask your doctor. What happens during the test?  You will take off your clothes from the waist up and put on a hospital gown. Electrodes or patches will be put on your chest. A blood pressure cuff will be put on your arm. Before you exercise, a computer will make a picture of your heart. To do this: You will lie down and a gel will be put on your chest. A wand will be moved over the gel. Sound waves from the wand will go to the computer to make the picture. Then, you will start to exercise. You may walk on a treadmill or pedal a bicycle. Your blood pressure and heart rhythm will be checked while you exercise. The exercise will get harder or faster. You will exercise until: Your heart reaches a certain level. You are too tired to go on. You cannot go on because of chest pain, weakness, or dizziness. You will lie down right away so another picture of your heart can be taken. The procedure may vary among doctors and hospitals. What can I expect after the test? After your test, it is common to have: Mild soreness. Mild tiredness. Your heart rate and blood pressure will be checked until they return to your normal levels. You should not have any new symptoms after this test. Follow these instructions at home: If your doctor says that you can, you may: Eat what you normally eat. Do your normal activities. Take over-the-counter and prescription medicines only as told by your doctor. Keep all follow-up  visits. It is up to you to get the results of your test. Ask how to get your results when they are ready. Contact a doctor if: You feel dizzy or light-headed. You have a fast or irregular heartbeat. You feel like you may vomit or you vomit. You have a headache. You feel short of breath. Get help right away if: You develop pain or pressure: In your chest. In your jaw or neck. Between your shoulders. That goes down your left arm. You faint. You have trouble breathing. These symptoms may be an emergency. Get medical help right away. Call your local emergency services (911 in the [place] Do not wait to see if the symptoms will go away. Do not drive yourself to the hospital. Summary This is a  test that checks how well your heart is working. Follow instructions about what you cannot eat or drink before the test. Ask your doctor if you should take your normal medicines before the test. Stop having caffeine 24 hours before the test. Do not smoke or use products with nicotine or tobacco in them for 4 hours before the test. During the test, your blood pressure and heart rhythm will be checked while you exercise. This information is not intended to replace advice given to you by your health care provider. Make sure you discuss any questions you have with your health care provider. Document Revised: 06/15/2021 Document Reviewed: 05/25/2020 Elsevier Patient Education  2022 [employer].

## 2021-09-15 ENCOUNTER — Telehealth: Payer: Self-pay

## 2021-09-15 NOTE — Telephone Encounter (Signed)
Called pt to see if [facility] would be an option for her stress echo but VM is full.

## 2021-09-15 NOTE — Telephone Encounter (Signed)
-----  Message from Frederic Jericho sent at 09/15/2021 11:15 AM EST ----- Regarding: Stress echo Good Morning,  I called pt to schedule stress echo that you ordered and unfortunately we are booked until Jan 2023 for appts. She has met her deductible for this year and cant afford to have in Jan. I have added her to the wait list if cancellation comes open.  Just FYI.Marland KitchenMarland Kitchen

## 2021-09-19 DIAGNOSIS — E782 Mixed hyperlipidemia: Secondary | ICD-10-CM | POA: Diagnosis not present

## 2021-09-19 DIAGNOSIS — I471 Supraventricular tachycardia: Secondary | ICD-10-CM | POA: Diagnosis not present

## 2021-09-20 ENCOUNTER — Telehealth: Payer: Self-pay

## 2021-09-20 LAB — LIPID PANEL
Chol/HDL Ratio: 3.6 ratio (ref 0.0–4.4)
Cholesterol, Total: 184 mg/dL (ref 100–199)
HDL: 51 mg/dL (ref >39)
LDL Chol Calc (NIH): 109 mg/dL — ABNORMAL HIGH (ref 0–99)
Triglycerides: 133 mg/dL (ref 0–149)
VLDL Cholesterol Cal: 24 mg/dL (ref 5–40)

## 2021-09-20 LAB — HEPATIC FUNCTION PANEL
ALT: 22 IU/L (ref 0–32)
AST: 19 IU/L (ref 0–40)
Albumin: 4.6 g/dL (ref 3.8–4.9)
Alkaline Phosphatase: 91 IU/L (ref 44–121)
Bilirubin Total: 0.5 mg/dL (ref 0.0–1.2)
Bilirubin, Direct: 0.11 mg/dL (ref 0.00–0.40)
Total Protein: 7.4 g/dL (ref 6.0–8.5)

## 2021-09-20 LAB — BASIC METABOLIC PANEL
BUN/Creatinine Ratio: 13 (ref 9–23)
BUN: 9 mg/dL (ref 6–24)
CO2: 24 mmol/L (ref 20–29)
Calcium: 9.3 mg/dL (ref 8.7–10.2)
Chloride: 102 mmol/L (ref 96–106)
Creatinine, Ser: 0.7 mg/dL (ref 0.57–1.00)
Glucose: 121 mg/dL — ABNORMAL HIGH (ref 70–99)
Potassium: 4.3 mmol/L (ref 3.5–5.2)
Sodium: 140 mmol/L (ref 134–144)
eGFR: 105 mL/min/{1.73_m2} (ref >59)

## 2021-09-20 NOTE — Telephone Encounter (Signed)
Called pt but full VM.

## 2021-09-20 NOTE — Progress Notes (Signed)
[facility] at [facility] [address] [place], [place] [address] [phone]  Date:  09/21/2021   Name:  Anne Casey   DOB:  [birth year]   MRN:  [id number]  PCP:  Darreld Mclean, MD    Chief Complaint: No chief complaint on file.   History of Present Illness:  Anne Casey is a 50 y.o. very pleasant female patient who presents with the following:  Anne Casey is here today with concern of ear pain-right ear Patient last seen by myself in January of this year, at that time she had superficial thrombophlebitis likely related to recent COVID-19 illness This has resolved and is no longer painful   Also history of hypertension, dyslipidemia, obesity, SVT status post ablation in March of this year  Seen by cardiology 11/30- 1. Essential hypertension   2. SVT (supraventricular tachycardia) ([place]), post ablation   3. Mixed dyslipidemia   4. Morbid obesity ([place])   5. Chest pain of uncertain etiology     PLAN:     In order of problems listed above: I discussed my findings with the patient in extensive length and primary prevention stressed. Chest pain: Atypical in nature however she has risk factors and wants to be evaluated and we will do a stress echo.  If she has any significant symptoms she knows to go to the nearest emergency room. Essential hypertension: Blood pressure stable.  She is worried about her overall health and therefore her blood pressure is elevated.  She has also had an element of whitecoat hypertension. Mixed dyslipidemia and obesity: Lifestyle modification urged risks of obesity explained and he plans to do better.  I will get a lipid check for her in the next few days. Patient will be seen in follow-up appointment in 6 months or earlier if the patient has any concerns   Patient has typically declined most routine screenings and vaccinations- she declines again today when I offered COVID-19 vaccine Pneumonia  vaccine Tetanus booster Shingrix Pap smear Colon cancer screening Mammogram Flu vaccine  Lab work done earlier this week- CMP, cbc and lipids only   Currently taking metoprolol 37.5 twice daily Lab Results  Component Value Date   HGBA1C 5.6 09/23/2020   Today pt notes a pain in front of and in her right ear- the left ear is ok She has noted this pain for about 3 days She thinks it may be from getting water in her ears when she is showering  She tried some peroxide drops which did seem to help No drainage typically hearing seems to be normal No other sx such as cough, sneezing or ST  She otherwise feels perfectly well No tooth pain  Also, she notes recurrent thick, white and crumbly fingernail on her left hand.  She burned this finger several years ago which seem to cause the nail change.  She used a course of terbinafine several years ago which was effective in getting her nail back to normal until more recently   Patient Active Problem List   Diagnosis Date Noted   Chest pain of uncertain etiology 99991111   Essential hypertension 02/14/2021   Mixed dyslipidemia 11/08/2020   Morbid obesity ([place]) 12/29/2019   High risk medication use 05/29/2019   History of Holter monitoring 09/2017   Palpitation    SVT (supraventricular tachycardia) ([place]), post ablation 08/09/2012    Past Medical History:  Diagnosis Date   Essential hypertension 02/14/2021  High risk medication use 05/29/2019   History of Holter monitoring 09/2017   sinus tachycardia   Mixed dyslipidemia 11/08/2020   Morbid obesity ([facility]) 12/29/2019   Palpitation    SVT (supraventricular tachycardia) ([facility])     Past Surgical History:  Procedure Laterality Date   NO PAST SURGERIES     SVT ABLATION N/A 12/20/2020   Procedure: SVT ABLATION;  Surgeon: Lanier Prude, MD;  Location: [facility] INVASIVE CV LAB;  Service: Cardiovascular;  Laterality: N/A;    Social History   Tobacco Use   Smoking status: Never    Smokeless tobacco: Never  Vaping Use   Vaping Use: Never used  Substance Use Topics   Alcohol use: No   Drug use: No    Family History  Problem Relation Age of Onset   Hypertension Mother    Colon cancer Mother    Congestive Heart Failure Father    Heart Problems Sister     No Known Allergies  Medication list has been reviewed and updated.  Current Outpatient Medications on File Prior to Visit  Medication Sig Dispense Refill   EPINEPHrine (EPIPEN 2-PAK) 0.3 mg/0.3 mL IJ SOAJ injection Inject 0.3 mLs (0.3 mg total) into the muscle as needed for anaphylaxis. 1 each 1   metoprolol tartrate (LOPRESSOR) 25 MG tablet TAKE 1 AND 1/2 TABLETS(37.5 MG) BY MOUTH TWICE DAILY 270 tablet 0   triamcinolone cream (KENALOG) 0.1 % APPLY 1 APPLICATION TOPICALLY TWICE DAILY USE AS NEEDED FOR RASH ON CHEST 30 g 0   No current facility-administered medications on file prior to visit.    Review of Systems:  As per HPI- otherwise negative.   Physical Examination: Vitals:   09/21/21 0944  BP: 114/72  Pulse: 67  Resp: 18  Temp: 97.8 F (36.6 C)  SpO2: 97%   Vitals:   09/21/21 0944  Weight: 255 lb (115.7 kg)   Body mass index is 39.94 kg/m. Ideal Body Weight:    GEN: no acute distress.  Obese, looks well HEENT: Atraumatic, Normocephalic. Bilateral TM wnl, oropharynx normal.  PEERL,EOMI. the right internal ear canal is slightly erythematous compared with the left No tooth pain or sensitivity No pain over the TMJ Ears and Nose: No external deformity. CV: RRR, No M/G/R. No JVD. No thrill. No extra heart sounds. PULM: CTA B, no wheezes, crackles, rhonchi. No retractions. No resp. distress. No accessory muscle use. ABD: S, NT, ND, +BS. No rebound. No HSM. EXTR: No c/c/e PSYCH: Normally interactive. Conversant.  Nail abnormality as below:     Assessment and Plan: Onychomycosis - Plan: terbinafine (LAMISIL) 250 MG tablet  Right ear pain - Plan: ofloxacin (FLOXIN OTIC) 0.3 % OTIC  solution  Patient seen today with concern of ear pain.  On exam she may have some mild otitis externa.  We will treat her with Floxin otic drops, I asked her to let me know if not getting better soon  Recurrent onychomycosis of the left ring finger. Her liver function tests were checked quite recently Okay to use a course of terbinafine  I have asked her to let me know if the fingernail is not growing out normally over the next several weeks  Signed Abbe Amsterdam, MD

## 2021-09-20 NOTE — Telephone Encounter (Signed)
-----  Message from Frederic Jericho sent at 09/15/2021 12:29 PM EST ----- Regarding: RE: Stress echo I forwarded that to [place]. :) Have a great day!  ----- Message ----- From: Jenean Lindau, MD Sent: 09/15/2021  11:53 AM EST To: Frederic Jericho Subject: RE: Stress echo                                If she can come to [facility] I can do it for her next week. ----- Message ----- From: Frederic Jericho Sent: 09/15/2021  11:18 AM EST To: Jenean Lindau, MD, Truddie Hidden, RN Subject: Stress echo                                    Good Morning,  I called pt to schedule stress echo that you ordered and unfortunately we are booked until Jan 2023 for appts. She has met her deductible for this year and cant afford to have in Jan. I have added her to the wait list if cancellation comes open.  Just FYI.Marland KitchenMarland Kitchen

## 2021-09-21 ENCOUNTER — Ambulatory Visit (INDEPENDENT_AMBULATORY_CARE_PROVIDER_SITE_OTHER): Payer: BC Managed Care – PPO | Admitting: Family Medicine

## 2021-09-21 VITALS — BP 114/72 | HR 67 | Temp 97.8°F | Resp 18 | Wt 255.0 lb

## 2021-09-21 DIAGNOSIS — B351 Tinea unguium: Secondary | ICD-10-CM

## 2021-09-21 DIAGNOSIS — H9201 Otalgia, right ear: Secondary | ICD-10-CM | POA: Diagnosis not present

## 2021-09-21 MED ORDER — TERBINAFINE HCL 250 MG PO TABS: 250.0000 mg | ORAL_TABLET | Freq: Every day | ORAL | 0 refills | Status: DC

## 2021-09-21 MED ORDER — OFLOXACIN 0.3 % OT SOLN: 10.0000 [drp] | Freq: Every day | OTIC | 0 refills | Status: AC

## 2021-09-21 NOTE — Patient Instructions (Addendum)
It was good to see you again today- please let me know if your ear is not better in the next few days!  Use the drops once a day for about one week  Lamisil 250 daily for 3 months for your fingernail- again, please alert me if not growing out new nail eventually   Please do consider getting routine services such as pap, mammogram, and immunizations.  Also please do set up your colonoscopy asap!

## 2021-09-29 DIAGNOSIS — R079 Chest pain, unspecified: Secondary | ICD-10-CM | POA: Diagnosis not present

## 2021-10-09 ENCOUNTER — Other Ambulatory Visit: Payer: Self-pay | Admitting: Cardiology

## 2021-11-04 ENCOUNTER — Other Ambulatory Visit: Payer: Self-pay | Admitting: Family Medicine

## 2022-01-26 ENCOUNTER — Encounter: Payer: Self-pay | Admitting: Cardiology

## 2022-01-26 ENCOUNTER — Ambulatory Visit (INDEPENDENT_AMBULATORY_CARE_PROVIDER_SITE_OTHER): Payer: Self-pay | Admitting: Cardiology

## 2022-01-26 VITALS — BP 130/76 | HR 59 | Ht 67.0 in | Wt 259.0 lb

## 2022-01-26 DIAGNOSIS — I471 Supraventricular tachycardia: Secondary | ICD-10-CM

## 2022-01-26 DIAGNOSIS — E782 Mixed hyperlipidemia: Secondary | ICD-10-CM

## 2022-01-26 DIAGNOSIS — R002 Palpitations: Secondary | ICD-10-CM

## 2022-01-26 DIAGNOSIS — I10 Essential (primary) hypertension: Secondary | ICD-10-CM

## 2022-01-26 NOTE — Progress Notes (Signed)
?Cardiology Office Note:   ? ?Date:  01/26/2022  ? ?ID:  [name], DOB December 18, 1970, MRN [id number] ? ?PCP:  Darreld Mclean, MD  ?Cardiologist:  Jenean Lindau, MD  ? ?Referring MD: Darreld Mclean, MD  ? ? ?ASSESSMENT:   ? ?1. SVT (supraventricular tachycardia) ([place]), post ablation   ?2. Essential hypertension   ?3. Palpitation   ?4. Morbid obesity ([place])   ?5. Mixed dyslipidemia   ? ?PLAN:   ? ?In order of problems listed above: ? ?Primary prevention stressed with the patient.  Importance of compliance with diet medication stressed and she vocalized understanding.  She was advised to walk at least half an hour a day 5 days a week and she promises to do so. ?Essential hypertension: Blood pressure stable and diet was emphasized. ?Palpitations and history of supraventricular tachycardia post ablation: This is a result and she is happy about it. ?Obesity and mixed dyslipidemia: Weight reduction stressed lipids are mildly elevated and caution was advised.  Lifestyle modification urged.  Risks of obesity explained and she promises to do better. ?Patient will be seen in follow-up appointment in 6 months or earlier if the patient has any concerns ? ? ? ?Medication Adjustments/Labs and Tests Ordered: ?Current medicines are reviewed at length with the patient today.  Concerns regarding medicines are outlined above.  ?Orders Placed This Encounter  ?Procedures  ? EKG 12-Lead  ? ?No orders of the defined types were placed in this encounter. ? ? ? ?No chief complaint on file. ?  ? ?History of Present Illness:   ? ?AARYA SIMM is a 51 y.o. female.  Patient has past medical history of supraventricular tachycardia post ablation, essential hypertension, palpitations, obesity and mixed dyslipidemia which is mild.  She denies any problems at this time and takes care of activities of daily living.  No chest pain orthopnea or PND.  At the time of my evaluation, the patient is alert awake oriented and in no  distress. ? ?Past Medical History:  ?Diagnosis Date  ? Chest pain of uncertain etiology 99991111  ? Essential hypertension 02/14/2021  ? High risk medication use 05/29/2019  ? History of Holter monitoring 09/2017  ? sinus tachycardia  ? Mixed dyslipidemia 11/08/2020  ? Morbid obesity ([place]) 12/29/2019  ? Palpitation   ? SVT (supraventricular tachycardia) ([place])   ? ? ?Past Surgical History:  ?Procedure Laterality Date  ? NO PAST SURGERIES    ? SVT ABLATION N/A 12/20/2020  ? Procedure: SVT ABLATION;  Surgeon: Vickie Epley, MD;  Location: [facility] CV LAB;  Service: Cardiovascular;  Laterality: N/A;  ? ? ?Current Medications: ?Current Meds  ?Medication Sig  ? EPINEPHrine (EPIPEN 2-PAK) 0.3 mg/0.3 mL IJ SOAJ injection Inject 0.3 mLs (0.3 mg total) into the muscle as needed for anaphylaxis.  ? metoprolol tartrate (LOPRESSOR) 25 MG tablet TAKE 1 AND 1/2 TABLETS(37.5 MG) BY MOUTH TWICE DAILY  ? ofloxacin (FLOXIN OTIC) 0.3 % OTIC solution Place 10 drops into the right ear daily. Use for about one week  ? triamcinolone cream (KENALOG) 0.1 % APPLY 1 APPLICATION TOPICALLY TWICE DAILY USE AS NEEDED FOR RASH ON CHEST  ?  ? ?Allergies:   Patient has no known allergies.  ? ?Social History  ? ?Socioeconomic History  ? Marital status: Single  ?  Spouse name: Not on file  ? Number of children: Not on file  ? Years of education: Not on file  ? Highest education level: Not on file  ?  Occupational History  ? Not on file  ?Tobacco Use  ? Smoking status: Never  ? Smokeless tobacco: Never  ?Vaping Use  ? Vaping Use: Never used  ?Substance and Sexual Activity  ? Alcohol use: No  ? Drug use: No  ? Sexual activity: Not on file  ?Other Topics Concern  ? Not on file  ?Social History Narrative  ? Not on file  ? ?Social Determinants of Health  ? ?Financial Resource Strain: Not on file  ?Food Insecurity: Not on file  ?Transportation Needs: Not on file  ?Physical Activity: Not on file  ?Stress: Not on file  ?Social Connections: Not on file  ?   ? ?Family History: ?The patient's family history includes Colon cancer in her mother; Congestive Heart Failure in her father; Heart Problems in her sister; Hypertension in her mother. ? ?ROS:   ?Please see the history of present illness.    ?All other systems reviewed and are negative. ? ?EKGs/Labs/Other Studies Reviewed:   ? ?The following studies were reviewed today: ?I discussed my findings with the patient at length. ? ? ?Recent Labs: ?09/19/2021: ALT 22; BUN 9; Creatinine, Ser 0.70; Potassium 4.3; Sodium 140  ?Recent Lipid Panel ?   ?Component Value Date/Time  ? CHOL 184 09/19/2021 0913  ? TRIG 133 09/19/2021 0913  ? HDL 51 09/19/2021 0913  ? CHOLHDL 3.6 09/19/2021 0913  ? CHOLHDL 4 04/30/2019 1341  ? VLDL 36.2 04/30/2019 1341  ? [place] 109 (H) 09/19/2021 0913  ? ? ?Physical Exam:   ? ?VS:  BP 130/76   Pulse (!) 59   Ht 5\' 7"  (1.702 m)   Wt 259 lb (117.5 kg)   SpO2 96%   BMI 40.57 kg/m?    ? ?Wt Readings from Last 3 Encounters:  ?01/26/22 259 lb (117.5 kg)  ?09/21/21 255 lb (115.7 kg)  ?09/14/21 254 lb 1.3 oz (115.2 kg)  ?  ? ?GEN: Patient is in no acute distress ?HEENT: Normal ?NECK: No JVD; No carotid bruits ?LYMPHATICS: No lymphadenopathy ?CARDIAC: Hear sounds regular, 2/6 systolic murmur at the apex. ?RESPIRATORY:  Clear to auscultation without rales, wheezing or rhonchi  ?ABDOMEN: Soft, non-tender, non-distended ?MUSCULOSKELETAL:  No edema; No deformity  ?SKIN: Warm and dry ?NEUROLOGIC:  Alert and oriented x 3 ?PSYCHIATRIC:  Normal affect  ? ?Signed, ?Jenean Lindau, MD  ?01/26/2022 11:03 AM    ?[place]  ?

## 2022-01-26 NOTE — Patient Instructions (Signed)

## 2022-02-12 ENCOUNTER — Other Ambulatory Visit: Payer: Self-pay | Admitting: Family Medicine

## 2022-02-12 DIAGNOSIS — R21 Rash and other nonspecific skin eruption: Secondary | ICD-10-CM

## 2022-05-17 ENCOUNTER — Other Ambulatory Visit: Payer: Self-pay | Admitting: Family Medicine

## 2022-08-11 ENCOUNTER — Ambulatory Visit
Admission: EM | Admit: 2022-08-11 | Discharge: 2022-08-11 | Disposition: A | Discharge status: Home / Self Care | Payer: Self-pay | Attending: Urgent Care | Admitting: Urgent Care

## 2022-08-11 DIAGNOSIS — J069 Acute upper respiratory infection, unspecified: Secondary | ICD-10-CM

## 2022-08-11 DIAGNOSIS — R0989 Other specified symptoms and signs involving the circulatory and respiratory systems: Secondary | ICD-10-CM

## 2022-08-11 LAB — POCT RAPID STREP A (OFFICE): Rapid Strep A Screen: NEGATIVE

## 2022-08-11 NOTE — Discharge Instructions (Addendum)
Follow up here or with your primary care provider if your symptoms are worsening or not improving.     

## 2022-08-11 NOTE — ED Triage Notes (Signed)
Pt. Presents to UC w/ c/o a cough and sore throat for the past 3 days. Pt. Has been treating w/ OTC medication and no relief.

## 2022-08-11 NOTE — ED Provider Notes (Signed)
Roderic Palau    CSN: [id number] Arrival date & time: 08/11/22  2595      History   Chief Complaint No chief complaint on file.   HPI Anne Casey is a 51 y.o. female.   HPI  Presents to UC with complaint of upper respiratory symptoms x3 days.  Symptoms are sore throat and nasal congestion with runny nose.  She endorses "a little" cough.  Denies fever, myalgias, chills.  Past Medical History:  Diagnosis Date   Chest pain of uncertain etiology 63/87/5643   Essential hypertension 02/14/2021   High risk medication use 05/29/2019   History of Holter monitoring 09/2017   sinus tachycardia   Mixed dyslipidemia 11/08/2020   Morbid obesity ([place]) 12/29/2019   Palpitation    SVT (supraventricular tachycardia) [facility])     Patient Active Problem List   Diagnosis Date Noted   Chest pain of uncertain etiology 32/95/1884   Essential hypertension 02/14/2021   Mixed dyslipidemia 11/08/2020   Morbid obesity ([place]) 12/29/2019   High risk medication use 05/29/2019   History of Holter monitoring 09/2017   Palpitation    SVT (supraventricular tachycardia) ([place]), post ablation 08/09/2012    Past Surgical History:  Procedure Laterality Date   NO PAST SURGERIES     SVT ABLATION N/A 12/20/2020   Procedure: SVT ABLATION;  Surgeon: Vickie Epley, MD;  Location: [facility];  Service: Cardiovascular;  Laterality: N/A;    OB History   No obstetric history on file.      Home Medications    Prior to Admission medications   Medication Sig Start Date End Date Taking? Authorizing Provider  EPINEPHrine (EPIPEN 2-PAK) 0.3 mg/0.3 mL IJ SOAJ injection Inject 0.3 mLs (0.3 mg total) into the muscle as needed for anaphylaxis. 03/29/20   Sable Feil, PA-C  metoprolol tartrate (LOPRESSOR) 25 MG tablet TAKE 1 AND 1/2 TABLETS(37.5 MG) BY MOUTH TWICE DAILY 05/17/22   Copland, Gay Filler, MD  ofloxacin (FLOXIN OTIC) 0.3 % OTIC solution Place 10 drops into the right ear daily. Use for  about one week 09/21/21   Copland, Gay Filler, MD  triamcinolone cream (KENALOG) 0.1 % APPLY 1 APPLICATION TOPICALLY TWICE DAILY USE AS NEEDED FOR RASH ON CHEST 02/13/22   Copland, Gay Filler, MD    Family History Family History  Problem Relation Age of Onset   Hypertension Mother    Colon cancer Mother    Congestive Heart Failure Father    Heart Problems Sister     Social History Social History   Tobacco Use   Smoking status: Never   Smokeless tobacco: Never  Vaping Use   Vaping Use: Never used  Substance Use Topics   Alcohol use: No   Drug use: No     Allergies   Patient has no known allergies.   Review of Systems Review of Systems   Physical Exam Triage Vital Signs ED Triage Vitals  Enc Vitals Group     BP      Pulse      Resp      Temp      Temp src      SpO2      Weight      Height      Head Circumference      Peak Flow      Pain Score      Pain Loc      Pain Edu?      Excl. in [place]?  No data found.  Updated Vital Signs There were no vitals taken for this visit.  Visual Acuity Right Eye Distance:   Left Eye Distance:   Bilateral Distance:    Right Eye Near:   Left Eye Near:    Bilateral Near:     Physical Exam Vitals reviewed.  Constitutional:      Appearance: Normal appearance.  HENT:     Nose: Congestion and rhinorrhea present.     Mouth/Throat:     Mouth: Mucous membranes are moist.     Pharynx: Posterior oropharyngeal erythema present. No oropharyngeal exudate.  Eyes:     Conjunctiva/sclera: Conjunctivae normal.     Pupils: Pupils are equal, round, and reactive to light.  Cardiovascular:     Rate and Rhythm: Normal rate and regular rhythm.     Pulses: Normal pulses.     Heart sounds: Normal heart sounds.  Pulmonary:     Effort: Pulmonary effort is normal.     Breath sounds: Normal breath sounds.  Skin:    General: Skin is warm and dry.  Neurological:     General: No focal deficit present.     Mental Status: She is alert  and oriented to person, place, and time.  Psychiatric:        Mood and Affect: Mood normal.        Behavior: Behavior normal.      UC Treatments / Results  Labs (all labs ordered are listed, but only abnormal results are displayed) Labs Reviewed - No data to display  EKG   Radiology No results found.  Procedures Procedures (including critical care time)  Medications Ordered in UC Medications - No data to display  Initial Impression / Assessment and Plan / UC Course  I have reviewed the triage vital signs and the nursing notes.  Pertinent labs & imaging results that were available during my care of the patient were reviewed by me and considered in my medical decision making (see chart for details).   Rapid strep is negative.  Suspect viral etiology for her symptoms.  Lungs CTAB.  Mild pharyngeal erythema is present without peritonsillar exudate.  Recommending OTC medications for symptom control including ibuprofen for relief of pharyngitis pain as well as cough/cold medication for sinus congestion.   Final Clinical Impressions(s) / UC Diagnoses   Final diagnoses:  None   Discharge Instructions   None    ED Prescriptions   None    PDMP not reviewed this encounter.   Charma Igo, [place] 08/11/22 [phone]

## 2022-08-15 ENCOUNTER — Other Ambulatory Visit: Payer: Self-pay | Admitting: Family Medicine

## 2022-08-15 ENCOUNTER — Ambulatory Visit (INDEPENDENT_AMBULATORY_CARE_PROVIDER_SITE_OTHER): Payer: Self-pay

## 2022-08-15 ENCOUNTER — Ambulatory Visit: Payer: Self-pay | Admitting: Family Medicine

## 2022-08-15 ENCOUNTER — Ambulatory Visit
Admission: EM | Admit: 2022-08-15 | Discharge: 2022-08-15 | Disposition: A | Discharge status: Home / Self Care | Payer: Self-pay | Attending: Emergency Medicine | Admitting: Emergency Medicine

## 2022-08-15 DIAGNOSIS — R042 Hemoptysis: Secondary | ICD-10-CM

## 2022-08-15 DIAGNOSIS — J209 Acute bronchitis, unspecified: Secondary | ICD-10-CM

## 2022-08-15 HISTORY — DX: Hemoptysis: R04.2

## 2022-08-15 MED ORDER — AZITHROMYCIN 250 MG PO TABS: 250.0000 mg | ORAL_TABLET | Freq: Every day | ORAL | 0 refills | Status: DC

## 2022-08-15 NOTE — ED Triage Notes (Signed)
Patient to Urgent Care with complaints of cough, sore throat, and chest congestion x5 days. Describes cough as productive, states at times she has seen blood.   Patient was seen on Friday and was strep negative. Feels like symptoms have worsened, woke up with cold sweats.

## 2022-08-15 NOTE — Progress Notes (Deleted)
Anne Casey , 1971/05/08, 51 y.o., female MRN: [id number] Patient Care Team    Relationship Specialty Notifications Start End  Copland, Gay Filler, MD PCP - General Family Medicine  08/09/12   Vickie Epley, MD PCP - Electrophysiology Cardiology  01/18/21     No chief complaint on file.    Subjective: Pt presents for an OV with complaints of *** of *** duration.  Associated symptoms include ***.  Pt has tried *** to ease their symptoms.      12/06/2016   10:53 AM 08/16/2015    8:51 AM  Depression screen PHQ 2/9  Decreased Interest 0 0  Down, Depressed, Hopeless 0 0  PHQ - 2 Score 0 0    No Known Allergies Social History   Social History Narrative   Not on file   Past Medical History:  Diagnosis Date   Chest pain of uncertain etiology 99991111   Essential hypertension 02/14/2021   High risk medication use 05/29/2019   History of Holter monitoring 09/2017   sinus tachycardia   Mixed dyslipidemia 11/08/2020   Morbid obesity ([place]) 12/29/2019   Palpitation    SVT (supraventricular tachycardia)    Past Surgical History:  Procedure Laterality Date   NO PAST SURGERIES     SVT ABLATION N/A 12/20/2020   Procedure: SVT ABLATION;  Surgeon: Vickie Epley, MD;  Location: [facility];  Service: Cardiovascular;  Laterality: N/A;   Family History  Problem Relation Age of Onset   Hypertension Mother    Colon cancer Mother    Congestive Heart Failure Father    Heart Problems Sister    Allergies as of 08/15/2022   No Known Allergies      Medication List        Accurate as of August 15, 2022 10:57 AM. If you have any questions, ask your nurse or doctor.          EPINEPHrine 0.3 mg/0.3 mL Soaj injection Commonly known as: EpiPen 2-Pak Inject 0.3 mLs (0.3 mg total) into the muscle as needed for anaphylaxis.   metoprolol tartrate 25 MG tablet Commonly known as: LOPRESSOR TAKE 1 AND 1/2 TABLETS(37.5 MG) BY MOUTH TWICE DAILY   ofloxacin 0.3 %  OTIC solution Commonly known as: Floxin Otic Place 10 drops into the right ear daily. Use for about one week   triamcinolone cream 0.1 % Commonly known as: KENALOG APPLY 1 APPLICATION TOPICALLY TWICE DAILY USE AS NEEDED FOR RASH ON CHEST        All past medical history, surgical history, allergies, family history, immunizations andmedications were updated in the EMR today and reviewed under the history and medication portions of their EMR.     ROS Negative, with the exception of above mentioned in HPI   Objective:  LMP  (LMP Unknown)  There is no height or weight on file to calculate BMI.  Physical Exam   No results found. No results found. No results found for this or any previous visit (from the past 24 hour(s)).  Assessment/Plan: Anne Casey is a 51 y.o. female present for OV for  *** Reviewed expectations re: course of current medical issues. Discussed self-management of symptoms. Outlined signs and symptoms indicating need for more acute intervention. Patient verbalized understanding and all questions were answered. Patient received an After-Visit Summary.    No orders of the defined types were placed in this encounter.  No orders of the defined types were placed in  this encounter.  Referral Orders  No referral(s) requested today     Note is dictated utilizing voice recognition software. Although note has been proof read prior to signing, occasional typographical errors still can be missed. If any questions arise, please do not hesitate to call for verification.   electronically signed by:  Howard Pouch, DO  [facility]

## 2022-08-15 NOTE — ED Provider Notes (Signed)
Anne Casey    CSN: [id number] Arrival date & time: 08/15/22  1056      History   Chief Complaint Chief Complaint  Patient presents with   Headache   Chest Congestion   Sore Throat    HPI Anne Casey is a 51 y.o. female.  Patient presents with 5-day history of congestion, runny nose, postnasal drip, cough.  She reports her cough is productive of green sputum with occasional possible blood in sputum.  No fever, chills, chest pain, shortness of breath, vomiting, diarrhea, or other symptoms.  Patient has been treating herself at home with ampicillin that she had previously gotten in another country ([place]); no OTC medications taken today.  Non-smoker.  Patient was seen at this urgent care on 08/11/2022; diagnosed with viral URI; treated symptomatically.  Her medical history includes hypertension, dyslipidemia, obesity.  The history is provided by the patient and medical records.    Past Medical History:  Diagnosis Date   Chest pain of uncertain etiology 09/14/2021   Essential hypertension 02/14/2021   High risk medication use 05/29/2019   History of Holter monitoring 09/2017   sinus tachycardia   Mixed dyslipidemia 11/08/2020   Morbid obesity ([facility]) 12/29/2019   Palpitation    SVT (supraventricular tachycardia)     Patient Active Problem List   Diagnosis Date Noted   Hemoptysis 08/15/2022   Chest pain of uncertain etiology 09/14/2021   Essential hypertension 02/14/2021   Mixed dyslipidemia 11/08/2020   Morbid obesity ([facility]) 12/29/2019   High risk medication use 05/29/2019   History of Holter monitoring 09/2017   Palpitation    SVT (supraventricular tachycardia) ([facility]), post ablation 08/09/2012    Past Surgical History:  Procedure Laterality Date   NO PAST SURGERIES     SVT ABLATION N/A 12/20/2020   Procedure: SVT ABLATION;  Surgeon: Lanier Prude, MD;  Location: [facility];  Service: Cardiovascular;  Laterality: N/A;    OB History   No  obstetric history on file.      Home Medications    Prior to Admission medications   Medication Sig Start Date End Date Taking? Authorizing Provider  azithromycin (ZITHROMAX) 250 MG tablet Take 1 tablet (250 mg total) by mouth daily. Take first 2 tablets together, then 1 every day until finished. 08/15/22  Yes Mickie Bail, NP  EPINEPHrine (EPIPEN 2-PAK) 0.3 mg/0.3 mL IJ SOAJ injection Inject 0.3 mLs (0.3 mg total) into the muscle as needed for anaphylaxis. 03/29/20   Joni Reining, PA-C  metoprolol tartrate (LOPRESSOR) 25 MG tablet TAKE 1 AND 1/2 TABLETS(37.5 MG) BY MOUTH TWICE DAILY 05/17/22   Copland, Gwenlyn Found, MD  ofloxacin (FLOXIN OTIC) 0.3 % OTIC solution Place 10 drops into the right ear daily. Use for about one week 09/21/21   Copland, Gwenlyn Found, MD  triamcinolone cream (KENALOG) 0.1 % APPLY 1 APPLICATION TOPICALLY TWICE DAILY USE AS NEEDED FOR RASH ON CHEST 02/13/22   Copland, Gwenlyn Found, MD    Family History Family History  Problem Relation Age of Onset   Hypertension Mother    Colon cancer Mother    Congestive Heart Failure Father    Heart Problems Sister     Social History Social History   Tobacco Use   Smoking status: Never   Smokeless tobacco: Never  Vaping Use   Vaping Use: Never used  Substance Use Topics   Alcohol use: No   Drug use: No     Allergies   Patient  has no known allergies.   Review of Systems Review of Systems  Constitutional:  Negative for chills and fever.  HENT:  Positive for congestion, postnasal drip and rhinorrhea. Negative for ear pain and sore throat.   Respiratory:  Positive for cough. Negative for shortness of breath.   Cardiovascular:  Negative for chest pain and palpitations.  Gastrointestinal:  Negative for diarrhea and vomiting.  Skin:  Negative for rash.  All other systems reviewed and are negative.    Physical Exam Triage Vital Signs ED Triage Vitals  Enc Vitals Group     BP      Pulse      Resp      Temp       Temp src      SpO2      Weight      Height      Head Circumference      Peak Flow      Pain Score      Pain Loc      Pain Edu?      Excl. in [place]?    No data found.  Updated Vital Signs BP (!) 141/84   Pulse 87   Temp 98.3 F (36.8 C)   Resp 18   Ht 5\' 7"  (1.702 m)   Wt 260 lb (117.9 kg)   LMP  (LMP Unknown)   SpO2 95%   BMI 40.72 kg/m   Visual Acuity Right Eye Distance:   Left Eye Distance:   Bilateral Distance:    Right Eye Near:   Left Eye Near:    Bilateral Near:     Physical Exam Vitals and nursing note reviewed.  Constitutional:      General: She is not in acute distress.    Appearance: She is well-developed. She is obese. She is not ill-appearing.  HENT:     Right Ear: Tympanic membrane normal.     Left Ear: Tympanic membrane normal.     Nose: Nose normal.     Mouth/Throat:     Mouth: Mucous membranes are moist.     Pharynx: Oropharynx is clear.     Comments: Clear PND. Cardiovascular:     Rate and Rhythm: Normal rate and regular rhythm.     Heart sounds: Normal heart sounds.  Pulmonary:     Effort: Pulmonary effort is normal. No respiratory distress.     Breath sounds: Normal breath sounds.  Musculoskeletal:     Cervical back: Neck supple.  Skin:    General: Skin is warm and dry.  Neurological:     Mental Status: She is alert.  Psychiatric:        Mood and Affect: Mood normal.        Behavior: Behavior normal.      UC Treatments / Results  Labs (all labs ordered are listed, but only abnormal results are displayed) Labs Reviewed - No data to display  EKG   Radiology DG Chest 2 View  Result Date: 08/15/2022 CLINICAL DATA:  Hemoptysis EXAM: CHEST - 2 VIEW COMPARISON:  11/18/2020 FINDINGS: Cardiac size is within normal limits. There are no signs of pulmonary edema or focal pulmonary consolidation. There is peribronchial thickening. There is no pleural effusion or pneumothorax. IMPRESSION: Peribronchial thickening suggests bronchitis.  There is no focal pulmonary consolidation. There is no pleural effusion. Electronically Signed   By: 01/16/2021 M.D.   On: 08/15/2022 11:35    Procedures Procedures (including critical care time)  Medications Ordered in  UC Medications - No data to display  Initial Impression / Assessment and Plan / UC Course  I have reviewed the triage vital signs and the nursing notes.  Pertinent labs & imaging results that were available during my care of the patient were reviewed by me and considered in my medical decision making (see chart for details).   Hemoptysis, acute bronchitis.  Chest x-ray shows bronchitis.  Treating with Zithromax.  Instructed patient to stop taking the ampicillin that she got in [place].  Instructed her to schedule follow-up appointment with her PCP in the next 1 to 2 days.  ED precautions discussed.  Education provided on hemoptysis and acute bronchitis.  Patient agrees to plan of care.   Final Clinical Impressions(s) / UC Diagnoses   Final diagnoses:  Hemoptysis  Acute bronchitis, unspecified organism     Discharge Instructions      Take the Zithromax as directed.  Stop taking the other antibiotic that you got in the other country.    Follow up with your primary care provider.    Go to the emergency department if you have worsening symptoms.          ED Prescriptions     Medication Sig Dispense Auth. Provider   azithromycin (ZITHROMAX) 250 MG tablet Take 1 tablet (250 mg total) by mouth daily. Take first 2 tablets together, then 1 every day until finished. 6 tablet Sharion Balloon, NP      PDMP not reviewed this encounter.   Sharion Balloon, NP 08/15/22 1148

## 2022-08-15 NOTE — Discharge Instructions (Addendum)
Take the Zithromax as directed.  Stop taking the other antibiotic that you got in the other country.    Follow up with your primary care provider.    Go to the emergency department if you have worsening symptoms.

## 2022-08-15 NOTE — Patient Instructions (Addendum)
Good to see you again today - I will be in touch with your labs Removed skin tags today- please alert me if any sign of infection such as redness, tenderness or drainage  Keep clean and dry today- tomorrow ok to shower as normal

## 2022-08-15 NOTE — Progress Notes (Unsigned)
[place] [facility] [address] [place], [place] [address] [phone] [id number] [phone]  Date:  08/21/2022   Name:  Anne Casey   DOB:  [birth year]   MRN:  [id number]  PCP:  Darreld Mclean, MD    Chief Complaint: UC follow up (08/11/22 and 08/15/22 Dx: Bronchitis and Hemoptysis, Rx: Zpack- completed over the weekend. /Concerns/ questions: skin tag Left eye/Declines flu shot/)   History of Present Illness:  Anne Casey is a 51 y.o. very pleasant female patient who presents with the following:  Seen today for follow-up after recent illness Last seen by myself in December  Also history of hypertension, dyslipidemia, obesity, SVT status post ablation in 2022  Seen by cardiology in April: Primary prevention stressed with the patient.  Importance of compliance with diet medication stressed and she vocalized understanding.  She was advised to walk at least half an hour a day 5 days a week and she promises to do so. Essential hypertension: Blood pressure stable and diet was emphasized. Palpitations and history of supraventricular tachycardia post ablation: This is a result and she is happy about it. Obesity and mixed dyslipidemia: Weight reduction stressed lipids are mildly elevated and caution was advised.  Lifestyle modification urged.  Risks of obesity explained and she promises to do better. Patient will be seen in follow-up appointment in 6 months or earlier if the patient has any concerns   She was at [facility] on 10/[facility]- thought to be viral URI Went back to UC on 10/31-    Hemoptysis, acute bronchitis.  Chest x-ray shows bronchitis.  Treating with Zithromax.  Instructed patient to stop taking the ampicillin that she got in [place].  Instructed her to schedule follow-up appointment with her PCP in the next 1 to 2 days.  ED precautions discussed.  Education provided on hemoptysis and acute bronchitis.  Patient agrees to plan of care.  Today [name]  notes she is feeling much better from her illness as above Cough is resolved No fever  She does not have a bit of a sore throat however  She would like to catch up on routine labs today.  She notes she oftentimes feels tired, would like testing for any pertinent vitamin deficiencies  She has has a few skin tags on her face and neck that she would like removed, they are bothersome and tend to catch in her clothing Patient Active Problem List   Diagnosis Date Noted   Hemoptysis 08/15/2022   Chest pain of uncertain etiology 99991111   Essential hypertension 02/14/2021   Mixed dyslipidemia 11/08/2020   Morbid obesity ([place]) 12/29/2019   High risk medication use 05/29/2019   History of Holter monitoring 09/2017   Palpitation    SVT (supraventricular tachycardia) ([place]), post ablation 08/09/2012    Past Medical History:  Diagnosis Date   Chest pain of uncertain etiology 99991111   Essential hypertension 02/14/2021   High risk medication use 05/29/2019   History of Holter monitoring 09/2017   sinus tachycardia   Mixed dyslipidemia 11/08/2020   Morbid obesity ([place]) 12/29/2019   Palpitation    SVT (supraventricular tachycardia)     Past Surgical History:  Procedure Laterality Date   NO PAST SURGERIES     SVT ABLATION N/A 12/20/2020   Procedure: SVT ABLATION;  Surgeon: Vickie Epley, MD;  Location: [facility];  Service: Cardiovascular;  Laterality: N/A;    Social History   Tobacco Use  Smoking status: Never   Smokeless tobacco: Never  Vaping Use   Vaping Use: Never used  Substance Use Topics   Alcohol use: No   Drug use: No    Family History  Problem Relation Age of Onset   Hypertension Mother    Colon cancer Mother    Congestive Heart Failure Father    Heart Problems Sister     No Known Allergies  Medication list has been reviewed and updated.  Current Outpatient Medications on File Prior to Visit  Medication Sig Dispense Refill   EPINEPHrine (EPIPEN  2-PAK) 0.3 mg/0.3 mL IJ SOAJ injection Inject 0.3 mLs (0.3 mg total) into the muscle as needed for anaphylaxis. 1 each 1   metoprolol tartrate (LOPRESSOR) 25 MG tablet Take 1.5 tablets (37.5 mg total) by mouth 2 (two) times daily. 90 tablet 0   ofloxacin (FLOXIN OTIC) 0.3 % OTIC solution Place 10 drops into the right ear daily. Use for about one week 5 mL 0   triamcinolone cream (KENALOG) 0.1 % APPLY 1 APPLICATION TOPICALLY TWICE DAILY USE AS NEEDED FOR RASH ON CHEST 30 g 0   No current facility-administered medications on file prior to visit.    Review of Systems:  As per HPI- otherwise negative.   Physical Examination: Vitals:   08/21/22 1557  BP: 130/82  Pulse: 70  Resp: 18  Temp: 97.7 F (36.5 C)  SpO2: 97%   Vitals:   08/21/22 1557  Weight: 255 lb 6.4 oz (115.8 kg)  Height: 5\' 7"  (1.702 m)   Body mass index is 40 kg/m. Ideal Body Weight: Weight in (lb) to have BMI = 25: 159.3  GEN: no acute distress.  Obese, looks well HEENT: Atraumatic, Normocephalic.  Ears and Nose: No external deformity. CV: RRR, No M/G/R. No JVD. No thrill. No extra heart sounds. PULM: CTA B, no wheezes, crackles, rhonchi. No retractions. No resp. distress. No accessory muscle use. ABD: S, NT, ND, +BS. No rebound. No HSM. EXTR: No c/c/e PSYCH: Normally interactive. Conversant.   Verbal consent obtained.  Patient has 4 skin tags, 3 on her neck and 1 below her left eye, that she would like removed. Cleaned skin tags with alcohol prep pad.  Grasped with forceps and snipped with scissors. Minimal to no blood loss, silver nitrate stick used on 1 site for hemostasis.  Applied Band-Aids as needed Patient tolerated well with no immediate complications She is asked to let me know if any sign of infection or other concerns  Results for orders placed or performed during the hospital encounter of 08/11/22  POCT rapid strep A  Result Value Ref Range   Rapid Strep A Screen Negative Negative     Assessment and Plan: Elevated glucose - Plan: Comprehensive metabolic panel, Hemoglobin A1c  Essential hypertension - Plan: CBC, Comprehensive metabolic panel  Fatigue, unspecified type - Plan: Comprehensive metabolic panel, TSH, VITAMIN D 25 Hydroxy (Vit-D Deficiency, Fractures), B12  Screening for iron deficiency anemia - Plan: CBC  Sore throat - Plan: POCT rapid strep A  Screening for hyperlipidemia - Plan: Lipid panel  Rash and nonspecific skin eruption - Plan: triamcinolone cream (KENALOG) 0.1 %  Patient seen today for follow-up.  Routine lab work is pending as above Recently seen with illness, treated with azithromycin.  Check for strep today, negative Remove skin tags as above-of note I am not charging this self-pay patient for this minimal procedure Will plan further follow- up pending labs.  Signed Lamar Blinks, MD  Received  her labs, message to pt  Results for orders placed or performed in visit on 08/21/22  CBC  Result Value Ref Range   WBC 8.7 4.0 - 10.5 K/uL   RBC 4.53 3.87 - 5.11 Mil/uL   Platelets 256.0 150.0 - 400.0 K/uL   Hemoglobin 12.9 12.0 - 15.0 g/dL   HCT 39.3 36.0 - 46.0 %   MCV 86.7 78.0 - 100.0 fl   MCHC 32.9 30.0 - 36.0 g/dL   RDW 13.2 11.5 - 15.5 %  Comprehensive metabolic panel  Result Value Ref Range   Sodium 140 135 - 145 mEq/L   Potassium 3.6 3.5 - 5.1 mEq/L   Chloride 104 96 - 112 mEq/L   CO2 29 19 - 32 mEq/L   Glucose, Bld 129 (H) 70 - 99 mg/dL   BUN 12 6 - 23 mg/dL   Creatinine, Ser 0.68 0.40 - 1.20 mg/dL   Total Bilirubin 0.3 0.2 - 1.2 mg/dL   Alkaline Phosphatase 88 39 - 117 U/L   AST 24 0 - 37 U/L   ALT 27 0 - 35 U/L   Total Protein 7.2 6.0 - 8.3 g/dL   Albumin 4.1 3.5 - 5.2 g/dL   GFR 100.10 >60.00 mL/min   Calcium 9.1 8.4 - 10.5 mg/dL  Hemoglobin A1c  Result Value Ref Range   Hgb A1c MFr Bld 6.2 4.6 - 6.5 %  Lipid panel  Result Value Ref Range   Cholesterol 156 0 - 200 mg/dL   Triglycerides 148.0 0.0 - 149.0  mg/dL   HDL 46.90 >39.00 mg/dL   VLDL 29.6 0.0 - 40.0 mg/dL   LDL Cholesterol 80 0 - 99 mg/dL   Total CHOL/HDL Ratio 3    NonHDL 109.55   TSH  Result Value Ref Range   TSH 0.92 0.35 - 5.50 uIU/mL  VITAMIN D 25 Hydroxy (Vit-D Deficiency, Fractures)  Result Value Ref Range   VITD 19.05 (L) 30.00 - 100.00 ng/mL  B12  Result Value Ref Range   Vitamin B-12 243 211 - 911 pg/mL  POCT rapid strep A  Result Value Ref Range   Rapid Strep A Screen Negative Negative

## 2022-08-21 ENCOUNTER — Ambulatory Visit (INDEPENDENT_AMBULATORY_CARE_PROVIDER_SITE_OTHER): Payer: Self-pay | Admitting: Family Medicine

## 2022-08-21 VITALS — BP 130/82 | HR 70 | Temp 97.7°F | Resp 18 | Ht 67.0 in | Wt 255.4 lb

## 2022-08-21 DIAGNOSIS — I10 Essential (primary) hypertension: Secondary | ICD-10-CM

## 2022-08-21 DIAGNOSIS — Z130 Encounter for screening for diseases of the blood and blood-forming organs and certain disorders involving the immune mechanism: Secondary | ICD-10-CM

## 2022-08-21 DIAGNOSIS — Z13220 Encounter for screening for lipoid disorders: Secondary | ICD-10-CM

## 2022-08-21 DIAGNOSIS — R21 Rash and other nonspecific skin eruption: Secondary | ICD-10-CM

## 2022-08-21 DIAGNOSIS — R7309 Other abnormal glucose: Secondary | ICD-10-CM

## 2022-08-21 DIAGNOSIS — E559 Vitamin D deficiency, unspecified: Secondary | ICD-10-CM

## 2022-08-21 DIAGNOSIS — R5383 Other fatigue: Secondary | ICD-10-CM

## 2022-08-21 DIAGNOSIS — J029 Acute pharyngitis, unspecified: Secondary | ICD-10-CM

## 2022-08-21 MED ORDER — TRIAMCINOLONE ACETONIDE 0.1 % EX CREA: TOPICAL_CREAM | CUTANEOUS | 2 refills | Status: DC

## 2022-08-22 ENCOUNTER — Encounter: Payer: Self-pay | Admitting: Family Medicine

## 2022-08-22 LAB — CBC
HCT: 39.3 % (ref 36.0–46.0)
Hemoglobin: 12.9 g/dL (ref 12.0–15.0)
MCHC: 32.9 g/dL (ref 30.0–36.0)
MCV: 86.7 fl (ref 78.0–100.0)
Platelets: 256 10*3/uL (ref 150.0–400.0)
RBC: 4.53 Mil/uL (ref 3.87–5.11)
RDW: 13.2 % (ref 11.5–15.5)
WBC: 8.7 10*3/uL (ref 4.0–10.5)

## 2022-08-22 LAB — VITAMIN D 25 HYDROXY (VIT D DEFICIENCY, FRACTURES): VITD: 19.05 ng/mL — ABNORMAL LOW (ref 30.00–100.00)

## 2022-08-22 LAB — COMPREHENSIVE METABOLIC PANEL
ALT: 27 U/L (ref 0–35)
AST: 24 U/L (ref 0–37)
Albumin: 4.1 g/dL (ref 3.5–5.2)
Alkaline Phosphatase: 88 U/L (ref 39–117)
BUN: 12 mg/dL (ref 6–23)
CO2: 29 mEq/L (ref 19–32)
Calcium: 9.1 mg/dL (ref 8.4–10.5)
Chloride: 104 mEq/L (ref 96–112)
Creatinine, Ser: 0.68 mg/dL (ref 0.40–1.20)
GFR: 100.1 mL/min (ref >60.00)
Glucose, Bld: 129 mg/dL — ABNORMAL HIGH (ref 70–99)
Potassium: 3.6 mEq/L (ref 3.5–5.1)
Sodium: 140 mEq/L (ref 135–145)
Total Bilirubin: 0.3 mg/dL (ref 0.2–1.2)
Total Protein: 7.2 g/dL (ref 6.0–8.3)

## 2022-08-22 LAB — HEMOGLOBIN A1C: Hgb A1c MFr Bld: 6.2 % (ref 4.6–6.5)

## 2022-08-22 LAB — VITAMIN B12: Vitamin B-12: 243 pg/mL (ref 211–911)

## 2022-08-22 LAB — LIPID PANEL
Cholesterol: 156 mg/dL (ref 0–200)
HDL: 46.9 mg/dL (ref >39.00)
LDL Cholesterol: 80 mg/dL (ref 0–99)
NonHDL: 109.55
Total CHOL/HDL Ratio: 3
Triglycerides: 148 mg/dL (ref 0.0–149.0)
VLDL: 29.6 mg/dL (ref 0.0–40.0)

## 2022-08-22 LAB — TSH: TSH: 0.92 u[IU]/mL (ref 0.35–5.50)

## 2022-08-22 LAB — POCT RAPID STREP A (OFFICE): Rapid Strep A Screen: NEGATIVE

## 2022-08-22 MED ORDER — VITAMIN D3 1.25 MG (50000 UT) PO CAPS: ORAL_CAPSULE | ORAL | 0 refills | Status: AC

## 2022-09-01 MED ORDER — CEPHALEXIN 500 MG PO CAPS: 500.0000 mg | ORAL_CAPSULE | Freq: Three times a day (TID) | ORAL | 0 refills | Status: AC

## 2022-11-08 ENCOUNTER — Encounter: Payer: Self-pay | Admitting: Cardiology

## 2022-11-08 ENCOUNTER — Ambulatory Visit: Payer: Self-pay | Attending: Cardiology | Admitting: Cardiology

## 2022-11-08 VITALS — BP 130/80 | HR 68 | Ht 67.0 in | Wt 255.0 lb

## 2022-11-08 DIAGNOSIS — I471 Supraventricular tachycardia, unspecified: Secondary | ICD-10-CM

## 2022-11-08 DIAGNOSIS — I10 Essential (primary) hypertension: Secondary | ICD-10-CM

## 2022-11-08 DIAGNOSIS — E782 Mixed hyperlipidemia: Secondary | ICD-10-CM

## 2022-11-08 NOTE — Patient Instructions (Signed)
Medication Instructions:  Your physician recommends that you continue on your current medications as directed. Please refer to the Current Medication list given to you today.  *If you need a refill on your cardiac medications before your next appointment, please call your pharmacy*   Lab Work: None ordered If you have labs (blood work) drawn today and your tests are completely normal, you will receive your results only by: MyChart Message (if you have MyChart) OR A paper copy in the mail If you have any lab test that is abnormal or we need to change your treatment, we will call you to review the results.   Testing/Procedures: None ordered   Follow-Up: At [facility], you and your health needs are our priority.  As part of our continuing mission to provide you with exceptional heart care, we have created designated Provider Care Teams.  These Care Teams include your primary Cardiologist (physician) and Advanced Practice Providers (APPs -  Physician Assistants and Nurse Practitioners) who all work together to provide you with the care you need, when you need it.  We recommend signing up for the patient portal called "MyChart".  Sign up information is provided on this After Visit Summary.  MyChart is used to connect with patients for Virtual Visits (Telemedicine).  Patients are able to view lab/test results, encounter notes, upcoming appointments, etc.  Non-urgent messages can be sent to your provider as well.   To learn more about what you can do with MyChart, go to [email or web address].    Your next appointment:   12 month(s)  The format for your next appointment:   In Person  Provider:   Rajan Revankar, MD    Other Instructions none  Important Information About Sugar      

## 2022-11-08 NOTE — Progress Notes (Signed)
Cardiology Office Note:    Date:  11/08/2022   ID:  Anne Casey, DOB 1971-09-07, MRN [id number]  PCP:  Darreld Mclean, MD  Cardiologist:  Jenean Lindau, MD   Referring MD: Darreld Mclean, MD    ASSESSMENT:    1. SVT (supraventricular tachycardia) ([place]), post ablation   2. Essential hypertension   3. Morbid obesity ([place])   4. Mixed dyslipidemia    PLAN:    In order of problems listed above:  Primary prevention stressed with the patient.  Importance of compliance with diet medication stressed and she vocalized understanding.  She was advised to walk at least half an hour a day 5 days a week and she promises to do so. Essential hypertension: This is under good control with medications.  She is doing good lifestyle modification.  I told her to exercise regularly. History of mixed dyslipidemia: Lipids reviewed and found to be fine.  Diet emphasized. SVT post ablation: No recurrence in symptoms and she is happy about it. Morbid obesity: Weight reduction stressed diet emphasized and she promises to do better. Patient will be seen in follow-up appointment in 12 months or earlier if the patient has any concerns    Medication Adjustments/Labs and Tests Ordered: Current medicines are reviewed at length with the patient today.  Concerns regarding medicines are outlined above.  No orders of the defined types were placed in this encounter.  No orders of the defined types were placed in this encounter.    No chief complaint on file.    History of Present Illness:    Anne Casey is a 52 y.o. female.  Patient has past medical history of essential hypertension dyslipidemia post ablation.  She has done well with lifestyle modification and does not need any medications for blood pressure.  She is morbidly obese and leads a sedentary lifestyle.  No chest pain orthopnea or PND.  At the time of my evaluation, the patient is alert awake oriented and in no distress.  Past  Medical History:  Diagnosis Date   Chest pain of uncertain etiology 47/42/5956   Essential hypertension 02/14/2021   Hemoptysis 08/15/2022   High risk medication use 05/29/2019   History of Holter monitoring 09/2017   sinus tachycardia   Mixed dyslipidemia 11/08/2020   Morbid obesity ([place]) 12/29/2019   Palpitation    SVT (supraventricular tachycardia)     Past Surgical History:  Procedure Laterality Date   SVT ABLATION N/A 12/20/2020   Procedure: SVT ABLATION;  Surgeon: Vickie Epley, MD;  Location: [facility];  Service: Cardiovascular;  Laterality: N/A;    Current Medications: Current Meds  Medication Sig   cephALEXin (KEFLEX) 500 MG capsule Take 1 capsule (500 mg total) by mouth 3 (three) times daily.   Cholecalciferol (VITAMIN D3) 1.25 MG (50000 UT) CAPS Take 1 weekly for 12 weeks   EPINEPHrine (EPIPEN 2-PAK) 0.3 mg/0.3 mL IJ SOAJ injection Inject 0.3 mLs (0.3 mg total) into the muscle as needed for anaphylaxis.   metoprolol tartrate (LOPRESSOR) 25 MG tablet Take 1.5 tablets (37.5 mg total) by mouth 2 (two) times daily.   ofloxacin (FLOXIN OTIC) 0.3 % OTIC solution Place 10 drops into the right ear daily. Use for about one week   triamcinolone cream (KENALOG) 0.1 % APPLY 1 APPLICATION TOPICALLY TWICE DAILY USE AS NEEDED FOR RASH ON CHEST     Allergies:   Patient has no known allergies.   Social History   Socioeconomic History  Marital status: Single    Spouse name: Not on file   Number of children: Not on file   Years of education: Not on file   Highest education level: Not on file  Occupational History   Not on file  Tobacco Use   Smoking status: Never   Smokeless tobacco: Never  Vaping Use   Vaping Use: Never used  Substance and Sexual Activity   Alcohol use: No   Drug use: No   Sexual activity: Not on file  Other Topics Concern   Not on file  Social History Narrative   Not on file   Social Determinants of Health   Financial Resource  Strain: Not on file  Food Insecurity: Not on file  Transportation Needs: Not on file  Physical Activity: Not on file  Stress: Not on file  Social Connections: Not on file     Family History: The patient's family history includes Colon cancer in her mother; Congestive Heart Failure in her father; Heart Problems in her sister; Hypertension in her mother.  ROS:   Please see the history of present illness.    All other systems reviewed and are negative.  EKGs/Labs/Other Studies Reviewed:    The following studies were reviewed today: I discussed my findings with the patient at length.   Recent Labs: 08/21/2022: ALT 27; BUN 12; Creatinine, Ser 0.68; Hemoglobin 12.9; Platelets 256.0; Potassium 3.6; Sodium 140; TSH 0.92  Recent Lipid Panel    Component Value Date/Time   CHOL 156 08/21/2022 1628   CHOL 184 09/19/2021 0913   TRIG 148.0 08/21/2022 1628   HDL 46.90 08/21/2022 1628   HDL 51 09/19/2021 0913   CHOLHDL 3 08/21/2022 1628   VLDL 29.6 08/21/2022 1628   LDLCALC 80 08/21/2022 1628   LDLCALC 109 (H) 09/19/2021 0913    Physical Exam:    VS:  BP 130/80   Pulse 68   Ht 5\' 7"  (1.702 m)   Wt 255 lb 0.6 oz (115.7 kg)   SpO2 96%   BMI 39.94 kg/m     Wt Readings from Last 3 Encounters:  11/08/22 255 lb 0.6 oz (115.7 kg)  08/21/22 255 lb 6.4 oz (115.8 kg)  08/15/22 260 lb (117.9 kg)     GEN: Patient is in no acute distress HEENT: Normal NECK: No JVD; No carotid bruits LYMPHATICS: No lymphadenopathy CARDIAC: Hear sounds regular, 2/6 systolic murmur at the apex. RESPIRATORY:  Clear to auscultation without rales, wheezing or rhonchi  ABDOMEN: Soft, non-tender, non-distended MUSCULOSKELETAL:  No edema; No deformity  SKIN: Warm and dry NEUROLOGIC:  Alert and oriented x 3 PSYCHIATRIC:  Normal affect   Signed, Jenean Lindau, MD  11/08/2022 10:28 AM    [place]

## 2022-11-23 ENCOUNTER — Other Ambulatory Visit: Payer: Self-pay | Admitting: Family Medicine

## 2023-07-20 ENCOUNTER — Other Ambulatory Visit: Payer: Self-pay | Admitting: Family Medicine

## 2023-07-20 DIAGNOSIS — Z1211 Encounter for screening for malignant neoplasm of colon: Secondary | ICD-10-CM

## 2023-07-20 DIAGNOSIS — Z1212 Encounter for screening for malignant neoplasm of rectum: Secondary | ICD-10-CM

## 2023-11-14 ENCOUNTER — Other Ambulatory Visit: Payer: Self-pay | Admitting: Family Medicine

## 2023-11-14 DIAGNOSIS — R21 Rash and other nonspecific skin eruption: Secondary | ICD-10-CM

## 2024-01-09 ENCOUNTER — Other Ambulatory Visit: Payer: Self-pay | Admitting: Family Medicine
